# Patient Record
Sex: Male | Born: 1970 | Race: White | Hispanic: No | Marital: Married | Smoking: Former smoker
Health system: Southern US, Community
[De-identification: ages and names within clinical notes are randomized; demographics above are authoritative.]

## PROBLEM LIST (undated history)

## (undated) DIAGNOSIS — F32A Depression, unspecified: Secondary | ICD-10-CM

## (undated) DIAGNOSIS — F329 Major depressive disorder, single episode, unspecified: Secondary | ICD-10-CM

## (undated) DIAGNOSIS — F410 Panic disorder [episodic paroxysmal anxiety] without agoraphobia: Secondary | ICD-10-CM

## (undated) DIAGNOSIS — G8929 Other chronic pain: Secondary | ICD-10-CM

## (undated) DIAGNOSIS — K219 Gastro-esophageal reflux disease without esophagitis: Secondary | ICD-10-CM

## (undated) DIAGNOSIS — M549 Dorsalgia, unspecified: Secondary | ICD-10-CM

## (undated) DIAGNOSIS — F418 Other specified anxiety disorders: Secondary | ICD-10-CM

## (undated) DIAGNOSIS — J939 Pneumothorax, unspecified: Secondary | ICD-10-CM

## (undated) DIAGNOSIS — F419 Anxiety disorder, unspecified: Secondary | ICD-10-CM

## (undated) HISTORY — PX: HERNIA REPAIR: SHX51

## (undated) HISTORY — PX: ESOPHAGOGASTRODUODENOSCOPY: SHX1529

## (undated) HISTORY — DX: Other specified anxiety disorders: F41.8

## (undated) HISTORY — DX: Gastro-esophageal reflux disease without esophagitis: K21.9

---

## 2011-04-19 ENCOUNTER — Encounter (HOSPITAL_COMMUNITY): Payer: Self-pay | Admitting: Pharmacy Technician

## 2011-04-19 ENCOUNTER — Other Ambulatory Visit: Payer: Self-pay | Admitting: Neurosurgery

## 2011-04-20 ENCOUNTER — Encounter (HOSPITAL_COMMUNITY): Payer: Self-pay

## 2011-04-20 ENCOUNTER — Encounter (HOSPITAL_COMMUNITY)
Admission: RE | Admit: 2011-04-20 | Discharge: 2011-04-20 | Disposition: A | Discharge status: Home / Self Care | Payer: Worker's Compensation | Source: Ambulatory Visit | Attending: Neurosurgery | Admitting: Neurosurgery

## 2011-04-20 HISTORY — DX: Anxiety disorder, unspecified: F41.9

## 2011-04-20 HISTORY — DX: Major depressive disorder, single episode, unspecified: F32.9

## 2011-04-20 HISTORY — DX: Gastro-esophageal reflux disease without esophagitis: K21.9

## 2011-04-20 HISTORY — DX: Depression, unspecified: F32.A

## 2011-04-20 HISTORY — DX: Other chronic pain: G89.29

## 2011-04-20 HISTORY — DX: Dorsalgia, unspecified: M54.9

## 2011-04-20 LAB — CBC
HCT: 48.4 % (ref 39.0–52.0)
Hemoglobin: 16.5 g/dL (ref 13.0–17.0)
MCH: 28.4 pg (ref 26.0–34.0)
MCV: 83.4 fL (ref 78.0–100.0)
RBC: 5.8 MIL/uL (ref 4.22–5.81)

## 2011-04-20 LAB — SURGICAL PCR SCREEN
MRSA, PCR: NEGATIVE
Staphylococcus aureus: NEGATIVE

## 2011-04-20 NOTE — Pre-Procedure Instructions (Signed)
20 Aaron Meyer  04/20/2011   Your procedure is scheduled on:  Thurs,Jan 24 @ 1210  Report to [facility] at 0900 AM.  Call this number if you have problems the morning of surgery: [phone]   Remember:   Do not eat food:After Midnight.  May have clear liquids: up to 4 Hours before arrival.(until 5 am)  Clear liquids include soda, tea, black coffee, apple or grape juice, broth.  Take these medicines the morning of surgery with A SIP OF WATER: Xanax,Dexilant,Paxil,Robaxin,and Pain Pill(if needed)   Do not wear jewelry, make-up or nail polish.  Do not wear lotions, powders, or perfumes. You may wear deodorant.  Do not shave 48 hours prior to surgery.  Do not bring valuables to the hospital.  Contacts, dentures or bridgework may not be worn into surgery.  Leave suitcase in the car. After surgery it may be brought to your room.  For patients admitted to the hospital, checkout time is 11:00 AM the day of discharge.   Patients discharged the day of surgery will not be allowed to drive home.  Name and phone number of your driver:   Special Instructions: CHG Shower Use Special Wash: 1/2 bottle night before surgery and 1/2 bottle morning of surgery.   Please read over the following fact sheets that you were given: Pain Booklet, Coughing and Deep Breathing, MRSA Information and Surgical Site Infection Prevention

## 2011-04-20 NOTE — Progress Notes (Signed)
Pt states his sugar tends to run on the low side

## 2011-04-20 NOTE — Progress Notes (Signed)
Stress test done > 43yrs ago  Never had an echo or a heart catherization

## 2011-04-21 MED ORDER — CEFAZOLIN SODIUM-DEXTROSE 2-3 GM-% IV SOLR
2.0000 g | INTRAVENOUS | Status: AC
  Administered 2011-04-22: 2 g via INTRAVENOUS
  Filled 2011-04-21: qty 50

## 2011-04-22 ENCOUNTER — Encounter (HOSPITAL_COMMUNITY): Payer: Self-pay | Admitting: Certified Registered"

## 2011-04-22 ENCOUNTER — Encounter (HOSPITAL_COMMUNITY): Payer: Self-pay | Admitting: *Deleted

## 2011-04-22 ENCOUNTER — Ambulatory Visit (HOSPITAL_COMMUNITY)
Admission: RE | Admit: 2011-04-22 | Discharge: 2011-04-23 | Disposition: A | Discharge status: Home / Self Care | Payer: Worker's Compensation | Source: Ambulatory Visit | Attending: Neurosurgery | Admitting: Neurosurgery

## 2011-04-22 ENCOUNTER — Ambulatory Visit (HOSPITAL_COMMUNITY): Payer: Worker's Compensation | Admitting: Certified Registered"

## 2011-04-22 ENCOUNTER — Encounter (HOSPITAL_COMMUNITY)
Admission: RE | Disposition: A | Discharge status: Home / Self Care | Payer: Self-pay | Source: Ambulatory Visit | Attending: Neurosurgery

## 2011-04-22 ENCOUNTER — Ambulatory Visit (HOSPITAL_COMMUNITY): Payer: Worker's Compensation

## 2011-04-22 DIAGNOSIS — M51379 Other intervertebral disc degeneration, lumbosacral region without mention of lumbar back pain or lower extremity pain: Secondary | ICD-10-CM | POA: Insufficient documentation

## 2011-04-22 DIAGNOSIS — M5137 Other intervertebral disc degeneration, lumbosacral region: Secondary | ICD-10-CM | POA: Insufficient documentation

## 2011-04-22 DIAGNOSIS — Z01812 Encounter for preprocedural laboratory examination: Secondary | ICD-10-CM | POA: Insufficient documentation

## 2011-04-22 DIAGNOSIS — M5126 Other intervertebral disc displacement, lumbar region: Secondary | ICD-10-CM | POA: Insufficient documentation

## 2011-04-22 DIAGNOSIS — K219 Gastro-esophageal reflux disease without esophagitis: Secondary | ICD-10-CM | POA: Insufficient documentation

## 2011-04-22 DIAGNOSIS — J45909 Unspecified asthma, uncomplicated: Secondary | ICD-10-CM | POA: Insufficient documentation

## 2011-04-22 DIAGNOSIS — F3289 Other specified depressive episodes: Secondary | ICD-10-CM | POA: Insufficient documentation

## 2011-04-22 DIAGNOSIS — F329 Major depressive disorder, single episode, unspecified: Secondary | ICD-10-CM | POA: Insufficient documentation

## 2011-04-22 DIAGNOSIS — M47817 Spondylosis without myelopathy or radiculopathy, lumbosacral region: Secondary | ICD-10-CM | POA: Insufficient documentation

## 2011-04-22 DIAGNOSIS — F411 Generalized anxiety disorder: Secondary | ICD-10-CM | POA: Insufficient documentation

## 2011-04-22 HISTORY — PX: LUMBAR LAMINECTOMY/DECOMPRESSION MICRODISCECTOMY: SHX5026

## 2011-04-22 LAB — GLUCOSE, CAPILLARY: Glucose-Capillary: 101 mg/dL — ABNORMAL HIGH (ref 70–99)

## 2011-04-22 SURGERY — LUMBAR LAMINECTOMY/DECOMPRESSION MICRODISCECTOMY
Anesthesia: General | Laterality: Left

## 2011-04-22 MED ORDER — FENTANYL CITRATE 0.05 MG/ML IJ SOLN
INTRAMUSCULAR | Status: AC
  Filled 2011-04-22: qty 2

## 2011-04-22 MED ORDER — PROPOFOL 10 MG/ML IV EMUL
INTRAVENOUS | Status: DC | PRN
  Administered 2011-04-22: 200 mg via INTRAVENOUS

## 2011-04-22 MED ORDER — KCL IN DEXTROSE-NACL 20-5-0.45 MEQ/L-%-% IV SOLN
INTRAVENOUS | Status: DC
  Filled 2011-04-22 (×5): qty 1000

## 2011-04-22 MED ORDER — HYDROCODONE-ACETAMINOPHEN 5-325 MG PO TABS
1.0000 | ORAL_TABLET | ORAL | Status: DC | PRN
  Administered 2011-04-23: 1 via ORAL
  Filled 2011-04-22: qty 2

## 2011-04-22 MED ORDER — ONDANSETRON HCL 4 MG/2ML IJ SOLN
INTRAMUSCULAR | Status: DC | PRN
  Administered 2011-04-22: 4 mg via INTRAVENOUS

## 2011-04-22 MED ORDER — LIDOCAINE-EPINEPHRINE 1 %-1:100000 IJ SOLN
INTRAMUSCULAR | Status: DC | PRN
  Administered 2011-04-22: 5 mL

## 2011-04-22 MED ORDER — CYCLOBENZAPRINE HCL 10 MG PO TABS
10.0000 mg | ORAL_TABLET | Freq: Three times a day (TID) | ORAL | Status: DC | PRN
  Administered 2011-04-22 – 2011-04-23 (×2): 10 mg via ORAL
  Filled 2011-04-22 (×2): qty 1

## 2011-04-22 MED ORDER — HEMOSTATIC AGENTS (NO CHARGE) OPTIME
TOPICAL | Status: DC | PRN
  Administered 2011-04-22: 1 via TOPICAL

## 2011-04-22 MED ORDER — NEOSTIGMINE METHYLSULFATE 1 MG/ML IJ SOLN
INTRAMUSCULAR | Status: DC | PRN
  Administered 2011-04-22: 5 mg via INTRAVENOUS

## 2011-04-22 MED ORDER — GLYCOPYRROLATE 0.2 MG/ML IJ SOLN
INTRAMUSCULAR | Status: DC | PRN
Start: 2011-04-22 — End: 2011-04-22
  Administered 2011-04-22: .6 mg via INTRAVENOUS

## 2011-04-22 MED ORDER — BISACODYL 10 MG RE SUPP: 10.0000 mg | Freq: Every day | RECTAL | Status: DC | PRN

## 2011-04-22 MED ORDER — MEPERIDINE HCL 25 MG/ML IJ SOLN: 6.2500 mg | INTRAMUSCULAR | Status: DC | PRN

## 2011-04-22 MED ORDER — SODIUM CHLORIDE 0.9 % IJ SOLN: 3.0000 mL | Freq: Two times a day (BID) | INTRAMUSCULAR | Status: DC

## 2011-04-22 MED ORDER — HYDROMORPHONE HCL PF 1 MG/ML IJ SOLN
0.2500 mg | INTRAMUSCULAR | Status: DC | PRN
  Administered 2011-04-22: 0.25 mg via INTRAVENOUS

## 2011-04-22 MED ORDER — KETOROLAC TROMETHAMINE 30 MG/ML IJ SOLN: 15.0000 mg | Freq: Once | INTRAMUSCULAR | Status: DC | PRN

## 2011-04-22 MED ORDER — SODIUM CHLORIDE 0.9 % IV SOLN
INTRAVENOUS | Status: AC
  Filled 2011-04-22: qty 500

## 2011-04-22 MED ORDER — MORPHINE SULFATE 4 MG/ML IJ SOLN: 4.0000 mg | INTRAMUSCULAR | Status: DC | PRN

## 2011-04-22 MED ORDER — FENTANYL CITRATE 0.05 MG/ML IJ SOLN
INTRAMUSCULAR | Status: DC | PRN
  Administered 2011-04-22: 100 ug via INTRAVENOUS
  Administered 2011-04-22 (×2): 50 ug via INTRAVENOUS

## 2011-04-22 MED ORDER — ROCURONIUM BROMIDE 100 MG/10ML IV SOLN
INTRAVENOUS | Status: DC | PRN
Start: 2011-04-22 — End: 2011-04-22
  Administered 2011-04-22: 10 mg via INTRAVENOUS
  Administered 2011-04-22: 50 mg via INTRAVENOUS

## 2011-04-22 MED ORDER — MENTHOL 3 MG MT LOZG: 1.0000 | LOZENGE | OROMUCOSAL | Status: DC | PRN

## 2011-04-22 MED ORDER — HYDROXYZINE HCL 50 MG/ML IM SOLN: 50.0000 mg | INTRAMUSCULAR | Status: DC | PRN

## 2011-04-22 MED ORDER — ACETAMINOPHEN 325 MG PO TABS: 650.0000 mg | ORAL_TABLET | ORAL | Status: DC | PRN

## 2011-04-22 MED ORDER — ACETAMINOPHEN 650 MG RE SUPP: 650.0000 mg | RECTAL | Status: DC | PRN

## 2011-04-22 MED ORDER — KETOROLAC TROMETHAMINE 30 MG/ML IJ SOLN: 30.0000 mg | Freq: Once | INTRAMUSCULAR | Status: DC

## 2011-04-22 MED ORDER — METHYLPREDNISOLONE ACETATE PF 80 MG/ML IJ SUSP
INTRAMUSCULAR | Status: DC | PRN
  Administered 2011-04-22: 80 mg

## 2011-04-22 MED ORDER — SODIUM CHLORIDE 0.9 % IV SOLN: 250.0000 mL | INTRAVENOUS | Status: DC

## 2011-04-22 MED ORDER — PROMETHAZINE HCL 25 MG/ML IJ SOLN: 6.2500 mg | INTRAMUSCULAR | Status: DC | PRN

## 2011-04-22 MED ORDER — SODIUM CHLORIDE 0.9 % IR SOLN
Status: DC | PRN
  Administered 2011-04-22: 13:00:00

## 2011-04-22 MED ORDER — ZOLPIDEM TARTRATE 5 MG PO TABS: 10.0000 mg | ORAL_TABLET | Freq: Every evening | ORAL | Status: DC | PRN

## 2011-04-22 MED ORDER — MIDAZOLAM HCL 5 MG/5ML IJ SOLN
INTRAMUSCULAR | Status: DC | PRN
  Administered 2011-04-22: 2 mg via INTRAVENOUS

## 2011-04-22 MED ORDER — BUPIVACAINE HCL (PF) 0.5 % IJ SOLN
INTRAMUSCULAR | Status: DC | PRN
  Administered 2011-04-22: 5 mL

## 2011-04-22 MED ORDER — MAGNESIUM HYDROXIDE 400 MG/5ML PO SUSP: 30.0000 mL | Freq: Every day | ORAL | Status: DC | PRN

## 2011-04-22 MED ORDER — KETOROLAC TROMETHAMINE 30 MG/ML IJ SOLN
30.0000 mg | Freq: Four times a day (QID) | INTRAMUSCULAR | Status: DC
  Administered 2011-04-22 – 2011-04-23 (×3): 30 mg via INTRAVENOUS
  Filled 2011-04-22 (×8): qty 1

## 2011-04-22 MED ORDER — OXYCODONE-ACETAMINOPHEN 5-325 MG PO TABS: 1.0000 | ORAL_TABLET | ORAL | Status: DC | PRN

## 2011-04-22 MED ORDER — HYDROXYZINE HCL 25 MG PO TABS: 50.0000 mg | ORAL_TABLET | ORAL | Status: DC | PRN

## 2011-04-22 MED ORDER — PHENOL 1.4 % MT LIQD: 1.0000 | OROMUCOSAL | Status: DC | PRN

## 2011-04-22 MED ORDER — ALUM & MAG HYDROXIDE-SIMETH 200-200-20 MG/5ML PO SUSP: 30.0000 mL | Freq: Four times a day (QID) | ORAL | Status: DC | PRN

## 2011-04-22 MED ORDER — HYDROMORPHONE HCL PF 1 MG/ML IJ SOLN
INTRAMUSCULAR | Status: AC
  Filled 2011-04-22: qty 1

## 2011-04-22 MED ORDER — 0.9 % SODIUM CHLORIDE (POUR BTL) OPTIME
TOPICAL | Status: DC | PRN
  Administered 2011-04-22: 1000 mL

## 2011-04-22 MED ORDER — BACITRACIN 50000 UNITS IM SOLR
INTRAMUSCULAR | Status: AC
  Filled 2011-04-22: qty 1

## 2011-04-22 MED ORDER — THROMBIN 5000 UNITS EX SOLR
CUTANEOUS | Status: DC | PRN
  Administered 2011-04-22 (×2): 5000 [IU] via TOPICAL

## 2011-04-22 MED ORDER — LACTATED RINGERS IV SOLN
INTRAVENOUS | Status: DC | PRN
Start: 2011-04-22 — End: 2011-04-22
  Administered 2011-04-22 (×3): via INTRAVENOUS

## 2011-04-22 MED ORDER — SODIUM CHLORIDE 0.9 % IJ SOLN
3.0000 mL | INTRAMUSCULAR | Status: DC | PRN
  Administered 2011-04-23: 3 mL via INTRAVENOUS

## 2011-04-22 SURGICAL SUPPLY — 59 items
ADH SKN CLS APL DERMABOND .7 (GAUZE/BANDAGES/DRESSINGS) ×2
APL SKNCLS STERI-STRIP NONHPOA (GAUZE/BANDAGES/DRESSINGS)
BAG DECANTER FOR FLEXI CONT (MISCELLANEOUS) ×2 IMPLANT
BENZOIN TINCTURE PRP APPL 2/3 (GAUZE/BANDAGES/DRESSINGS) IMPLANT
BLADE SURG ROTATE 9660 (MISCELLANEOUS) IMPLANT
BRUSH SCRUB EZ PLAIN DRY (MISCELLANEOUS) ×2 IMPLANT
BUR ACORN 6.0 ACORN (BURR) IMPLANT
BUR ACRON 5.0MM COATED (BURR) IMPLANT
BUR MATCHSTICK NEURO 3.0 LAGG (BURR) ×2 IMPLANT
CANISTER SUCTION 2500CC (MISCELLANEOUS) ×2 IMPLANT
CLOTH BEACON ORANGE TIMEOUT ST (SAFETY) ×2 IMPLANT
CONT SPEC 4OZ CLIKSEAL STRL BL (MISCELLANEOUS) IMPLANT
DERMABOND ADVANCED (GAUZE/BANDAGES/DRESSINGS) ×2
DERMABOND ADVANCED .7 DNX12 (GAUZE/BANDAGES/DRESSINGS) IMPLANT
DRAPE LAPAROTOMY 100X72X124 (DRAPES) ×2 IMPLANT
DRAPE MICROSCOPE LEICA (MISCELLANEOUS) ×2 IMPLANT
DRAPE POUCH INSTRU U-SHP 10X18 (DRAPES) ×2 IMPLANT
DRSG EMULSION OIL 3X3 NADH (GAUZE/BANDAGES/DRESSINGS) IMPLANT
ELECT REM PT RETURN 9FT ADLT (ELECTROSURGICAL) ×2
ELECTRODE REM PT RTRN 9FT ADLT (ELECTROSURGICAL) ×1 IMPLANT
GAUZE SPONGE 4X4 16PLY XRAY LF (GAUZE/BANDAGES/DRESSINGS) IMPLANT
GLOVE BIOGEL PI IND STRL 8 (GLOVE) ×1 IMPLANT
GLOVE BIOGEL PI INDICATOR 8 (GLOVE) ×1
GLOVE ECLIPSE 7.5 STRL STRAW (GLOVE) ×4 IMPLANT
GLOVE EXAM NITRILE LRG STRL (GLOVE) IMPLANT
GLOVE EXAM NITRILE MD LF STRL (GLOVE) ×1 IMPLANT
GLOVE EXAM NITRILE XL STR (GLOVE) IMPLANT
GLOVE EXAM NITRILE XS STR PU (GLOVE) IMPLANT
GLOVE INDICATOR 8.0 STRL GRN (GLOVE) ×1 IMPLANT
GOWN BRE IMP SLV AUR LG STRL (GOWN DISPOSABLE) ×2 IMPLANT
GOWN BRE IMP SLV AUR XL STRL (GOWN DISPOSABLE) ×2 IMPLANT
GOWN STRL REIN 2XL LVL4 (GOWN DISPOSABLE) ×1 IMPLANT
KIT BASIN OR (CUSTOM PROCEDURE TRAY) ×2 IMPLANT
KIT ROOM TURNOVER OR (KITS) ×2 IMPLANT
NDL HYPO 18GX1.5 BLUNT FILL (NEEDLE) IMPLANT
NDL SPNL 18GX3.5 QUINCKE PK (NEEDLE) ×1 IMPLANT
NDL SPNL 22GX3.5 QUINCKE BK (NEEDLE) ×1 IMPLANT
NEEDLE HYPO 18GX1.5 BLUNT FILL (NEEDLE) IMPLANT
NEEDLE SPNL 18GX3.5 QUINCKE PK (NEEDLE) ×2 IMPLANT
NEEDLE SPNL 22GX3.5 QUINCKE BK (NEEDLE) ×2 IMPLANT
NS IRRIG 1000ML POUR BTL (IV SOLUTION) ×2 IMPLANT
PACK LAMINECTOMY NEURO (CUSTOM PROCEDURE TRAY) ×2 IMPLANT
PAD ARMBOARD 7.5X6 YLW CONV (MISCELLANEOUS) ×6 IMPLANT
PATTIES SURGICAL .5 X1 (DISPOSABLE) ×2 IMPLANT
RUBBERBAND STERILE (MISCELLANEOUS) ×4 IMPLANT
SPONGE GAUZE 4X4 12PLY (GAUZE/BANDAGES/DRESSINGS) IMPLANT
SPONGE LAP 4X18 X RAY DECT (DISPOSABLE) IMPLANT
SPONGE SURGIFOAM ABS GEL SZ50 (HEMOSTASIS) ×2 IMPLANT
STRIP CLOSURE SKIN 1/2X4 (GAUZE/BANDAGES/DRESSINGS) IMPLANT
SUT PROLENE 6 0 BV (SUTURE) IMPLANT
SUT VIC AB 1 CT1 18XBRD ANBCTR (SUTURE) ×1 IMPLANT
SUT VIC AB 1 CT1 8-18 (SUTURE) ×2
SUT VIC AB 2-0 CP2 18 (SUTURE) ×2 IMPLANT
SUT VIC AB 3-0 SH 8-18 (SUTURE) IMPLANT
SYR 20CC LL (SYRINGE) ×2 IMPLANT
SYR 5ML LL (SYRINGE) ×1 IMPLANT
TOWEL OR 17X24 6PK STRL BLUE (TOWEL DISPOSABLE) ×2 IMPLANT
TOWEL OR 17X26 10 PK STRL BLUE (TOWEL DISPOSABLE) ×2 IMPLANT
WATER STERILE IRR 1000ML POUR (IV SOLUTION) ×2 IMPLANT

## 2011-04-22 NOTE — Transfer of Care (Signed)
Immediate Anesthesia Transfer of Care Note  Patient: Aaron Meyer  Procedure(s) Performed:  LUMBAR LAMINECTOMY/DECOMPRESSION MICRODISCECTOMY - LEFT Lumbar five sacal one laminotomy and microdiskectomy  Patient Location: PACU  Anesthesia Type: General  Level of Consciousness: awake  Airway & Oxygen Therapy: Patient Spontanous Breathing  Post-op Assessment: Report given to PACU RN  Post vital signs: stable  Complications: No apparent anesthesia complications

## 2011-04-22 NOTE — Anesthesia Postprocedure Evaluation (Signed)
  Anesthesia Post-op Note  Patient: Aaron Meyer  Procedure(s) Performed:  LUMBAR LAMINECTOMY/DECOMPRESSION MICRODISCECTOMY - LEFT Lumbar five sacal one laminotomy and microdiskectomy  Patient Location: PACU  Anesthesia Type: General  Level of Consciousness: awake and alert   Airway and Oxygen Therapy: Patient Spontanous Breathing and Patient connected to nasal cannula oxygen  Post-op Pain: none  Post-op Assessment: Post-op Vital signs reviewed  Post-op Vital Signs: Reviewed and stable  Complications: No apparent anesthesia complications

## 2011-04-22 NOTE — Anesthesia Preprocedure Evaluation (Addendum)
Anesthesia Evaluation  Patient identified by MRN, date of birth, ID band Patient awake    Reviewed: Allergy & Precautions, H&P , NPO status , Patient's Chart, lab work & pertinent test results, reviewed documented beta blocker date and time   History of Anesthesia Complications Negative for: history of anesthetic complications  Airway Mallampati: I  Neck ROM: Full  Mouth opening: Limited Mouth Opening  Dental  (+) Teeth Intact, Poor Dentition, Chipped and Dental Advisory Given   Pulmonary neg pulmonary ROS, asthma ,  clear to auscultation        Cardiovascular neg cardio ROS Regular Normal    Neuro/Psych PSYCHIATRIC DISORDERS Anxiety Depression    GI/Hepatic GERD-  Medicated and Controlled,  Endo/Other    Renal/GU      Musculoskeletal   Abdominal   Peds  Hematology   Anesthesia Other Findings   Reproductive/Obstetrics                          Anesthesia Physical Anesthesia Plan  ASA: II  Anesthesia Plan: General   Post-op Pain Management:    Induction: Intravenous  Airway Management Planned: Oral ETT  Additional Equipment:   Intra-op Plan:   Post-operative Plan: Extubation in OR  Informed Consent: I have reviewed the patients History and Physical, chart, labs and discussed the procedure including the risks, benefits and alternatives for the proposed anesthesia with the patient or authorized representative who has indicated his/her understanding and acceptance.   Dental advisory given  Plan Discussed with: CRNA and Surgeon  Anesthesia Plan Comments:         Anesthesia Quick Evaluation

## 2011-04-22 NOTE — Plan of Care (Signed)
Problem: Consults Goal: Diagnosis - Spinal Surgery Outcome: Not Applicable Date Met:  04/22/11 Lumbar Laminectomy (Complex)

## 2011-04-22 NOTE — Anesthesia Procedure Notes (Signed)
Procedure Name: Intubation Date/Time: 04/22/2011 1:05 PM Performed by: Margaree Mackintosh Pre-anesthesia Checklist: Patient identified, Timeout performed, Emergency Drugs available, Suction available and Patient being monitored Patient Re-evaluated:Patient Re-evaluated prior to inductionOxygen Delivery Method: Circle System Utilized Preoxygenation: Pre-oxygenation with 100% oxygen Intubation Type: IV induction Ventilation: Mask ventilation without difficulty and Oral airway inserted - appropriate to patient size Laryngoscope Size: Mac and 3 Grade View: Grade I Tube type: Oral Tube size: 7.5 mm Number of attempts: 1 Airway Equipment and Method: stylet Placement Confirmation: ETT inserted through vocal cords under direct vision,  positive ETCO2 and breath sounds checked- equal and bilateral Secured at: 22 cm Tube secured with: Tape Dental Injury: Teeth and Oropharynx as per pre-operative assessment

## 2011-04-22 NOTE — Progress Notes (Signed)
Filed Vitals:   04/22/11 0917 04/22/11 1445 04/22/11 1515  BP: 163/87    Pulse: 76    Temp: 97 F (36.1 C) 98 F (36.7 C) 97.9 F (36.6 C)  TempSrc: Oral    Resp: 20    SpO2: 98%      CBC  Basename 04/20/11 0906  WBC 7.6  HGB 16.5  HCT 48.4  PLT 171   Patient resting recovery room , anesthesia has cleared substantially. Following commands with all 4 extremities. Good strength in lower extremities. Incision clean and dry. No void yet.  Plan: To be transferred to 3500 unit. To ambulate once anesthesia fully cleared.  Hewitt Shorts, MD 04/22/2011, 3:24 PM

## 2011-04-22 NOTE — Preoperative (Signed)
Beta Blockers   Reason not to administer Beta Blockers:Not Applicable. No home beta blockers 

## 2011-04-22 NOTE — Op Note (Signed)
04/22/2011  2:28 PM  PATIENT:  Aaron Meyer  42 y.o. male  PRE-OPERATIVE DIAGNOSIS:  lumbar herniated disc lumbar five sacral one degenerative disc disease lumbar spondylosis lumbar radiculopathy  POST-OPERATIVE DIAGNOSIS:  umbar herniated disc lumbar five sacral one degenerative disc disease lumbar spondylosis lumbar radiculopathy  PROCEDURE:  Procedure(s): LUMBAR LAMINECTOMY/DECOMPRESSION MICRODISCECTOMY, left L5-S1  SURGEON:  Surgeon(s): Hewitt Shorts, MD  ASSISTANTS: Colon Branch, M.D.  ANESTHESIA:   general  EBL:  Total I/O In: 1000 [I.V.:1000] Out: 25 [Blood:25]  BLOOD ADMINISTERED:none  COUNT: Correct per nursing staff  DICTATION: Patient was brought to the operating room and placed under general endotracheal anesthesia. Patient was turned to prone position the lumbar region was prepped with Betadine soap and solution and draped in a sterile fashion. The midline was infiltrated with local anesthetic with epinephrine. A localizing x-ray was taken and the L5-S1 level was identified. Midline incision was made over the L5-S1 level and was carried down through the subcutaneous tissue to the lumbar fascia. The lumbar fascia was incised on the left side and the paraspinal muscles were dissected from the spinous processes and lamina in a subperiosteal fashion. Another x-ray was taken and the L5-S1 intralaminar space was identified. Laminotomy was performed using the high-speed drill and Kerrison punches. The ligamentum flavum was carefully resected. The underlying thecal sac and nerve root were identified. The disc herniation was identified and the thecal sac and nerve root gently retracted medially. The disc herniation was found in the left lateral epidural space below the level of the disc space. A large fragment was removed en bloc. Several smaller pieces were removed as well. We then examined the epidural space, no further fragments were found and the annulus of the disc  appeared intact. It was felt that there be no benefit to entering the disc space, and it was felt that good decompression of the thecal sac and nerve root had been achieved. Once the discectomy was completed and good decompression of the thecal sac and nerve had been achieved hemostasis was established with the use of bipolar cautery and Gelfoam with thrombin. The Gelfoam was removed and hemostasis confirmed. We then instilled 2 cc of fentanyl and 80 mg of Depo-Medrol into the epidural space. Deep fascia was closed with interrupted undyed 1 Vicryl sutures. Scarpa's fascia was closed with interrupted undyed 1 Vicryl sutures in the subcutaneous and subcuticular layer were closed with interrupted inverted 2-0 undyed Vicryl sutures. The skin edges were approximated with Dermabond. Following surgery the patient was turned back to a supine position to be reversed from the anesthetic extubated and transferred to the recovery room for further care.   PLAN OF CARE: Admit to inpatient   PATIENT DISPOSITION:  PACU - hemodynamically stable.   Delay start of Pharmacological VTE agent (>24hrs) due to surgical blood loss or risk of bleeding:  yes

## 2011-04-22 NOTE — H&P (Signed)
Subjective: Patient is a 41 y.o. male who is admitted for treatment of severe disabling left lumbar radiculopathy secondary to large left L5-S1 lumbar disc herniation with fragment has migrated caudally behind the body of S1, compressing the exiting left S1 nerve root. Patient is admitted for a left L5-S1 lumbar laminotomy and microdiscectomy.    Past Medical History  Diagnosis Date  . Pneumothorax     couple of times but didn't require a chest tube;age 64 and 26  . Chronic back pain     herniated disc,ddd,spondylosis and radiculopathy  . GERD (gastroesophageal reflux disease)     takes Dexilant daily  . Hemorrhoids   . Anxiety     takes Xanax as needed  . Depression     takes Paxil daily    Past Surgical History  Procedure Date  . Hernia repair     umbilical as a baby  . Esophagogastroduodenoscopy     Prescriptions prior to admission  Medication Sig Dispense Refill  . ALPRAZolam (XANAX) 0.5 MG tablet Take 0.5 mg by mouth 2 (two) times daily as needed. For sleep and anxiety      . calcium carbonate (TUMS EX) 750 MG chewable tablet Chew 2-3 tablets by mouth 3 (three) times daily as needed. heartburn      . dexlansoprazole (DEXILANT) 60 MG capsule Take 60 mg by mouth daily.      Marland Kitchen HYDROcodone-acetaminophen (NORCO) 5-325 MG per tablet Take 1-2 tablets by mouth every 6 (six) hours as needed. For pain      . meloxicam (MOBIC) 7.5 MG tablet Take 7.5 mg by mouth 2 (two) times daily.      . methocarbamol (ROBAXIN) 500 MG tablet Take 500 mg by mouth 3 (three) times daily. For muscle spasms      . PARoxetine (PAXIL) 20 MG tablet Take 20 mg by mouth every morning.       Allergies  Allergen Reactions  . Biaxin Nausea And Vomiting  . Other Other (See Comments)    Eggs and shrimp (hives)    History  Substance Use Topics  . Smoking status: Current Everyday Smoker -- 1.0 packs/day for 20 years  . Smokeless tobacco: Not on file  . Alcohol Use: No    Family History  Problem Relation Age  of Onset  . Anesthesia problems Neg Hx   . Hypotension Neg Hx   . Malignant hyperthermia Neg Hx   . Pseudochol deficiency Neg Hx      Review of Systems A comprehensive review of systems was negative.  Objective: Vital signs in last 24 hours: Temp:  [97 F (36.1 C)] 97 F (36.1 C) (01/24 0917) Pulse Rate:  [76] 76  (01/24 0917) Resp:  [20] 20  (01/24 0917) BP: (163)/(87) 163/87 mmHg (01/24 0917) SpO2:  [98 %] 98 % (01/24 0917)  EXAM: Well-developed well-nourished white male in no acute distress. Lungs are clear to auscultation , the patient has symmetrical respiratory excursion. Heart has a regular rate and rhythm normal S1 and S2 no murmur.   Abdomen is soft nontender nondistended bowel sounds are present. Extremity examination shows no clubbing cyanosis or edema. Musculoskeletal examination shows no tenderness to palpation in the lumbar region. Forward flexion is limited by pain at about 20. Straight leg raising is possible left at 50. Straight leg raising is negative on the right.  Motor examination shows 5 over 5 strength in the lower extremities including the iliopsoas quadriceps dorsiflexor extensor hallicus  longus and plantar flexor bilaterally.  Sensation is intact to pinprick in the distal lower extremities. Reflexes are symmetrical bilaterally. No pathologic reflexes are present. Patient has a normal gait and stance.   Data Review:CBC    Component Value Date/Time   WBC 7.6 04/20/2011 0906   RBC 5.80 04/20/2011 0906   HGB 16.5 04/20/2011 0906   HCT 48.4 04/20/2011 0906   PLT 171 04/20/2011 0906   MCV 83.4 04/20/2011 0906   MCH 28.4 04/20/2011 0906   MCHC 34.1 04/20/2011 0906   RDW 13.0 04/20/2011 0906    Assessment/Plan: Patient with a large left L5-S1 lumbar disc herniation for a left L5-S1 lumbar laminotomy and microdiscectomy. I've discussed with the patient the nature of his condition, the nature the surgical procedure, the typical length of surgery, hospital stay, and  overall recuperation. We discussed limitations postoperatively. I discussed risks of surgery including risks of infection, bleeding, possibly need for transfusion, the risk of nerve root dysfunction with pain, weakness, numbness, or paresthesias, or risk of dural tear and CSF leakage and possible need for further surgery, the risk of recurrent disc herniation and the possible need for further surgery, and the risk of anesthetic complications including myocardial infarction, stroke, pneumonia, and death. Understanding all this the patient does wish to proceed with surgery and is admitted for such.    Hewitt Shorts, MD 04/22/2011 12:21 PM

## 2011-04-23 ENCOUNTER — Encounter: Payer: Self-pay | Admitting: *Deleted

## 2011-04-23 MED ORDER — HYDROCODONE-ACETAMINOPHEN 5-325 MG PO TABS: 1.0000 | ORAL_TABLET | ORAL | Status: DC | PRN

## 2011-04-23 NOTE — Discharge Summary (Signed)
Physician Discharge Summary  Patient ID: Aaron Meyer MRN: [id number] DOB/AGE: [id number] 43 y.o.  Admit date: 04/22/2011 Discharge date: 04/23/2011  Admission Diagnoses: Left L5-S1 lumbar disc herniation, lumbar degenerative disc disease, lumbar spondylosis, lumbar radiculopathy  Discharge Diagnoses: Left L5-S1 lumbar disc herniation, lumbar degenerative disc disease, lumbar spondylosis, lumbar radiculopathy  Discharged Condition: good  Hospital Course: Patient was admitted underwent a left L5-S1 lumbar laminotomy and microdiscectomy. He has done well following surgery with good relief of his radicular pain. He is up and ambulating actively. His incision is clean and dry and healing well. He has been given instructions regarding wound care and activities following discharge. He is to return for followup with me in 3 weeks.  Discharge Exam: Blood pressure 112/67, pulse 79, temperature 98.2 F (36.8 C), temperature source Oral, resp. rate 18, SpO2 95.00%.  Disposition: Home   Medication List  As of 04/23/2011  7:41 AM   TAKE these medications         ALPRAZolam 0.5 MG tablet   Commonly known as: XANAX   Take 0.5 mg by mouth 2 (two) times daily as needed. For sleep and anxiety      calcium carbonate 750 MG chewable tablet   Commonly known as: TUMS EX   Chew 2-3 tablets by mouth 3 (three) times daily as needed. heartburn      dexlansoprazole 60 MG capsule   Commonly known as: DEXILANT   Take 60 mg by mouth daily.      HYDROcodone-acetaminophen 5-325 MG per tablet   Commonly known as: NORCO   Take 1-2 tablets by mouth every 6 (six) hours as needed. For pain      HYDROcodone-acetaminophen 5-325 MG per tablet   Commonly known as: NORCO   Take 1-2 tablets by mouth every 4 (four) hours as needed.      meloxicam 7.5 MG tablet   Commonly known as: MOBIC   Take 7.5 mg by mouth 2 (two) times daily.      methocarbamol 500 MG tablet   Commonly known as: ROBAXIN   Take 500 mg by  mouth 3 (three) times daily. For muscle spasms      PARoxetine 20 MG tablet   Commonly known as: PAXIL   Take 20 mg by mouth every morning.             Signed: Hewitt Shorts, MD 04/23/2011, 7:41 AM

## 2011-04-27 ENCOUNTER — Emergency Department (HOSPITAL_COMMUNITY): Payer: Worker's Compensation

## 2011-04-27 ENCOUNTER — Emergency Department (HOSPITAL_COMMUNITY)
Admission: EM | Admit: 2011-04-27 | Discharge: 2011-04-27 | Discharge status: Against Medical Advice | Payer: Self-pay | Attending: Emergency Medicine | Admitting: Emergency Medicine

## 2011-04-27 ENCOUNTER — Encounter (HOSPITAL_COMMUNITY): Payer: Self-pay | Admitting: Physical Medicine and Rehabilitation

## 2011-04-27 ENCOUNTER — Inpatient Hospital Stay (HOSPITAL_COMMUNITY)
Admission: EM | Admit: 2011-04-27 | Discharge: 2011-05-07 | DRG: 030 | Disposition: A | Discharge status: Home / Self Care | Payer: Worker's Compensation | Attending: Neurosurgery | Admitting: Neurosurgery

## 2011-04-27 DIAGNOSIS — J45909 Unspecified asthma, uncomplicated: Secondary | ICD-10-CM | POA: Diagnosis present

## 2011-04-27 DIAGNOSIS — F329 Major depressive disorder, single episode, unspecified: Secondary | ICD-10-CM | POA: Diagnosis present

## 2011-04-27 DIAGNOSIS — Z888 Allergy status to other drugs, medicaments and biological substances status: Secondary | ICD-10-CM

## 2011-04-27 DIAGNOSIS — Z0389 Encounter for observation for other suspected diseases and conditions ruled out: Secondary | ICD-10-CM | POA: Insufficient documentation

## 2011-04-27 DIAGNOSIS — R51 Headache: Secondary | ICD-10-CM | POA: Diagnosis present

## 2011-04-27 DIAGNOSIS — K219 Gastro-esophageal reflux disease without esophagitis: Secondary | ICD-10-CM | POA: Diagnosis present

## 2011-04-27 DIAGNOSIS — G988 Other disorders of nervous system: Principal | ICD-10-CM | POA: Diagnosis present

## 2011-04-27 DIAGNOSIS — F3289 Other specified depressive episodes: Secondary | ICD-10-CM | POA: Diagnosis present

## 2011-04-27 DIAGNOSIS — F411 Generalized anxiety disorder: Secondary | ICD-10-CM | POA: Diagnosis present

## 2011-04-27 DIAGNOSIS — G971 Other reaction to spinal and lumbar puncture: Secondary | ICD-10-CM

## 2011-04-27 LAB — CBC
HCT: 48 % (ref 39.0–52.0)
Hemoglobin: 17.2 g/dL — ABNORMAL HIGH (ref 13.0–17.0)
MCH: 29.5 pg (ref 26.0–34.0)
MCHC: 35.8 g/dL (ref 30.0–36.0)
MCV: 82.2 fL (ref 78.0–100.0)
Platelets: 166 K/uL (ref 150–400)
RBC: 5.84 MIL/uL — ABNORMAL HIGH (ref 4.22–5.81)
RDW: 12.7 % (ref 11.5–15.5)
WBC: 9.1 K/uL (ref 4.0–10.5)

## 2011-04-27 LAB — BASIC METABOLIC PANEL
CO2: 28 mEq/L (ref 19–32)
Chloride: 100 mEq/L (ref 96–112)
Creatinine, Ser: 0.99 mg/dL (ref 0.50–1.35)
GFR calc Af Amer: >90 mL/min (ref >90)
Potassium: 4.2 mEq/L (ref 3.5–5.1)
Sodium: 138 mEq/L (ref 135–145)

## 2011-04-27 LAB — BASIC METABOLIC PANEL WITH GFR
BUN: 23 mg/dL (ref 6–23)
Calcium: 9.5 mg/dL (ref 8.4–10.5)
GFR calc non Af Amer: >90 mL/min (ref >90)
Glucose, Bld: 113 mg/dL — ABNORMAL HIGH (ref 70–99)

## 2011-04-27 MED ORDER — DIPHENHYDRAMINE HCL 50 MG/ML IJ SOLN
25.0000 mg | Freq: Once | INTRAMUSCULAR | Status: AC
  Administered 2011-04-27: 25 mg via INTRAVENOUS
  Filled 2011-04-27: qty 1

## 2011-04-27 MED ORDER — ZOLPIDEM TARTRATE 10 MG PO TABS
10.0000 mg | ORAL_TABLET | Freq: Every evening | ORAL | Status: DC | PRN
  Administered 2011-04-28 – 2011-05-04 (×2): 10 mg via ORAL
  Filled 2011-04-27 (×2): qty 1

## 2011-04-27 MED ORDER — ONDANSETRON HCL 4 MG/2ML IJ SOLN
4.0000 mg | INTRAMUSCULAR | Status: DC | PRN
  Administered 2011-04-30 – 2011-05-06 (×3): 4 mg via INTRAVENOUS
  Filled 2011-04-27 (×2): qty 2

## 2011-04-27 MED ORDER — SODIUM CHLORIDE 0.9 % IV SOLN
INTRAVENOUS | Status: DC
  Administered 2011-04-27 – 2011-05-06 (×14): via INTRAVENOUS

## 2011-04-27 MED ORDER — MORPHINE SULFATE 4 MG/ML IJ SOLN
2.0000 mg | INTRAMUSCULAR | Status: DC | PRN
  Administered 2011-04-28 (×2): 2 mg via INTRAVENOUS
  Filled 2011-04-27 (×2): qty 1

## 2011-04-27 MED ORDER — PAROXETINE HCL 20 MG PO TABS: 20.0000 mg | ORAL_TABLET | Freq: Every evening | ORAL | Status: AC

## 2011-04-27 MED ORDER — SODIUM CHLORIDE 0.9 % IV BOLUS (SEPSIS)
1000.0000 mL | Freq: Once | INTRAVENOUS | Status: AC
  Administered 2011-04-27: 1000 mL via INTRAVENOUS

## 2011-04-27 MED ORDER — PANTOPRAZOLE SODIUM 40 MG IV SOLR
40.0000 mg | Freq: Once | INTRAVENOUS | Status: AC
  Administered 2011-04-27: 40 mg via INTRAVENOUS
  Filled 2011-04-27: qty 40

## 2011-04-27 MED ORDER — SODIUM CHLORIDE 0.9 % IJ SOLN
3.0000 mL | Freq: Two times a day (BID) | INTRAMUSCULAR | Status: DC
  Administered 2011-04-28 – 2011-05-04 (×7): 3 mL via INTRAVENOUS

## 2011-04-27 MED ORDER — SODIUM CHLORIDE 0.9 % IV SOLN
INTRAVENOUS | Status: DC
  Administered 2011-04-29: 01:00:00 via INTRAVENOUS

## 2011-04-27 MED ORDER — PANTOPRAZOLE SODIUM 40 MG PO TBEC: 40.0000 mg | DELAYED_RELEASE_TABLET | Freq: Every day | ORAL | Status: AC

## 2011-04-27 MED ORDER — SODIUM CHLORIDE 0.9 % IV SOLN: 250.0000 mL | INTRAVENOUS | Status: DC

## 2011-04-27 MED ORDER — FENTANYL CITRATE 0.05 MG/ML IJ SOLN
50.0000 ug | Freq: Once | INTRAMUSCULAR | Status: AC
  Administered 2011-04-27: 50 ug via INTRAVENOUS
  Filled 2011-04-27: qty 2

## 2011-04-27 MED ORDER — SODIUM CHLORIDE 0.9 % IJ SOLN: 3.0000 mL | INTRAMUSCULAR | Status: DC | PRN

## 2011-04-27 MED ORDER — METOCLOPRAMIDE HCL 5 MG/ML IJ SOLN
10.0000 mg | Freq: Once | INTRAMUSCULAR | Status: AC
  Administered 2011-04-27: 10 mg via INTRAVENOUS
  Filled 2011-04-27: qty 2

## 2011-04-27 NOTE — ED Notes (Signed)
Pt presents to department for evaluation of headache, N/V, dizziness and blurred vision. Onset Sunday evening. Pt had recent ruptured disc repair by Dr. Bettina Gavia on 04/22/11. 9/10 "throbbing" pain at the time. Pt conscious alert and oriented x4. No signs of distress at the time. Wife at bedside.

## 2011-04-27 NOTE — ED Provider Notes (Signed)
History     CSN: [id number]  Arrival date & time 04/27/11  1420   First MD Initiated Contact with Patient 04/27/11 1522      Chief Complaint  Patient presents with  . Headache  . Nausea  . Emesis    (Consider location/radiation/quality/duration/timing/severity/associated sxs/prior treatment) HPI Comments: Patient had lumbar surgery on Thursday with Dr. Newell Coral. By Friday evening into Saturday, the patient began developing some mild headaches. 2 days ago the headaches have 95 to a much worse degree and is now associated with photophobia and nausea and vomiting. He does not have a history of migraine headaches. Denies fever or chills. He reports that when he is lying still the headache eases off and is only a 1-2/10 pain. However if he tries to sit up or move his head, the headache per family gets worse up to a 10 out of 10. He denies a stiff neck, skin rash. He denies chest pain or abdominal pain. Patient's spouse reports that she spoke to Dr. Newell Coral earlier who recommended that the patient lay flat on his back for the next 3 days. Have her do to his continued nausea and vomiting which appears to be getting worse as well as the episodes of severity of his headaches, she felt that it was more prudent for him to be evaluated here in emergency department.  The history is provided by the patient and the spouse.    Past Medical History  Diagnosis Date  . Pneumothorax     couple of times but didn't require a chest tube;age 86 and 26  . Chronic back pain     herniated disc,ddd,spondylosis and radiculopathy  . GERD (gastroesophageal reflux disease)     takes Dexilant daily  . Hemorrhoids   . Anxiety     takes Xanax as needed  . Depression     takes Paxil daily    Past Surgical History  Procedure Date  . Hernia repair     umbilical as a baby  . Esophagogastroduodenoscopy   . Lumbar laminectomy/decompression microdiscectomy 04/22/2011    Procedure: LUMBAR LAMINECTOMY/DECOMPRESSION  MICRODISCECTOMY;  Surgeon: Hewitt Shorts, MD;  Location: [facility];  Service: Neurosurgery;  Laterality: Left;  LEFT Lumbar five sacal one laminotomy and microdiskectomy    Family History  Problem Relation Age of Onset  . Anesthesia problems Neg Hx   . Hypotension Neg Hx   . Malignant hyperthermia Neg Hx   . Pseudochol deficiency Neg Hx     History  Substance Use Topics  . Smoking status: Current Everyday Smoker -- 1.0 packs/day for 20 years  . Smokeless tobacco: Not on file  . Alcohol Use: No      Review of Systems  Constitutional: Negative for fever and chills.  HENT: Negative for neck stiffness.   Eyes: Positive for photophobia.  Gastrointestinal: Positive for nausea and vomiting.  Musculoskeletal: Positive for back pain.  Skin: Negative for rash.  Neurological: Positive for headaches.  All other systems reviewed and are negative.    Allergies  Biaxin and Other  Home Medications   Current Outpatient Rx  Name Route Sig Dispense Refill  . ALPRAZOLAM 0.5 MG PO TABS Oral Take 0.5 mg by mouth 2 (two) times daily as needed. For sleep and anxiety    . CALCIUM CARBONATE ANTACID 750 MG PO CHEW Oral Chew 2-3 tablets by mouth 3 (three) times daily as needed. heartburn    . DEXLANSOPRAZOLE 60 MG PO CPDR Oral Take 60 mg by mouth  daily.    Marland Kitchen HYDROCODONE-ACETAMINOPHEN 5-325 MG PO TABS Oral Take 1-2 tablets by mouth every 6 (six) hours as needed. For pain    . IBUPROFEN 200 MG PO TABS Oral Take 200 mg by mouth every 6 (six) hours as needed. For pain    . MELOXICAM 7.5 MG PO TABS Oral Take 7.5 mg by mouth 2 (two) times daily.    Marland Kitchen METHOCARBAMOL 500 MG PO TABS Oral Take 500 mg by mouth 3 (three) times daily. For muscle spasms    . PAROXETINE HCL 20 MG PO TABS Oral Take 20 mg by mouth every evening.       BP 128/87  Pulse 59  Temp(Src) 97.8 F (36.6 C) (Oral)  Resp 18  SpO2 95%  Physical Exam  Nursing note and vitals reviewed. Constitutional: He is oriented to person,  place, and time. He appears well-developed and well-nourished. No distress.  HENT:  Head: Normocephalic and atraumatic.  Eyes: Conjunctivae and EOM are normal. Pupils are equal, round, and reactive to light.  Neck: Normal range of motion. Neck supple.  Cardiovascular: Normal rate and regular rhythm.   Pulmonary/Chest: Effort normal.  Abdominal: He exhibits no distension. There is no tenderness.  Musculoskeletal: He exhibits no tenderness.  Neurological: He is alert and oriented to person, place, and time. No cranial nerve deficit. Coordination normal.  Skin: Skin is warm and dry. No rash noted. He is not diaphoretic.  Psychiatric: He has a normal mood and affect.    ED Course  Procedures (including critical care time)  Labs Reviewed  CBC - Abnormal; Notable for the following:    RBC 5.84 (*)    Hemoglobin 17.2 (*)    All other components within normal limits  BASIC METABOLIC PANEL - Abnormal; Notable for the following:    Glucose, Bld 113 (*)    All other components within normal limits   No results found.   1. Post lumbar puncture headache     4:55 PM After initial dose of meds and IVF bolus, pt reports he feels much improved, no HA at present.  Will give another liter of IVF's then ambulate to see how he feels.     6PM, pt felt improved, but unable to sit up, stand or ambulate due to recurrence of HA.  I spoke to Dr. Jeral Fruit who would see pt in the ED. Pt is moved to the CDU, I discussed with PAC Laveda Norman and Dr. Juleen China as well.  Likely admission. MDM  Patient's history and clinical examination suggests that he has a process similar to a post LP headache with CSF leakage. Patient's headache is minimal when he is laying still, however with any kind of movement of his head, the headache gets much worse immediately. Patient is afebrile without rash and no neck stiffness, therefore do not feel that he has meningitis. My plan is to give him IV Reglan as well as Benadryl, some mild  analgesics such as fentanyl and will bolus him with IV fluids. Given if this is a post LP type of headache, a blood patch probably is not indicated since he just had lumbar surgery. I will need to discuss his case with Dr. Newell Coral if symptom control with the above treatments are not effective. I think he is stable to be moved to the CDU for holding to see if his symptoms do not improve in consultation with Dr. Newell Coral if symptoms are not being improved.       [name]. Fletcher Rathbun,  MD 04/28/11 5784

## 2011-04-27 NOTE — ED Notes (Signed)
Pt resting quietly at the time. Denies headache at the time. States he feels a lot better. Resting quietly at the time. Vital signs stable at the present. Wife remains at bedside. Given bedside urinal.

## 2011-04-27 NOTE — ED Provider Notes (Signed)
Patient has been seen and evaluate by Dr. [name], who requests to have patient sent over to CDU for further management. Patient has a recent back surgery who presents to the ED complaining of headache with associated nausea and vomiting. Headache is likely secondary to CSF leak. Patient has been receiving migraine cocktail provides some relief. Dr. Jeral Fruit will see patient in the ED to formulate a disposition.  9:10 PM Patient is currently in no acute distress. He rates his headache to be a 3/10. Patient requests for Protonix due to having some epigastric abdominal pain.  I will continue to monitor his care.  10:13 PM Patient has been seeing a Dr. Jeral Fruit and will be admitted. He is currently in no acute distress.  Fayrene Helper, PA-C 04/27/11 2214

## 2011-04-27 NOTE — ED Notes (Signed)
[id number] Ready 

## 2011-04-27 NOTE — ED Notes (Signed)
Midlevel at bedside evaluating patient 

## 2011-04-27 NOTE — ED Notes (Signed)
Patient was unable to walk, he stated his head still was hurting so he said he wanted to lie back down.

## 2011-04-27 NOTE — ED Notes (Signed)
Pt presents to department for evaluation of headache, blurred vision, and N/V. Onset Sunday while at home, no relief from symptoms. States he had ruptured disc repair by Dr. Bettina Gavia on 1/24. Pt states pain and nausea become worse with movement and walking. Also states blurred vision and photosensitivity. He is alert and oriented x4. No neurological deficits noted. 8/10 "throbbing" headache upon arrival to ED. No active vomiting.

## 2011-04-27 NOTE — ED Notes (Signed)
Pt able to keep fluid down, but unable to stand and walk. States his head is hurting again and movement makes it much worse.

## 2011-04-27 NOTE — ED Notes (Signed)
Pt states he is comfortable when lying flat but that HA returns when standing

## 2011-04-27 NOTE — ED Notes (Signed)
CDU full at the time. Nurse to call back. 

## 2011-04-27 NOTE — ED Notes (Signed)
Neurosurgeon at bedside evaluating patient.

## 2011-04-27 NOTE — ED Notes (Signed)
States severe headache over the past few days with nausea and vomiting. Started Saturday night. S/P laminectomy Thursday. States he was unable to get up by self due to dizziness. Rates the pain as 3/10

## 2011-04-27 NOTE — H&P (Signed)
Aaron Meyer is an 41 y.o. male.   Chief Complaint: headache HPI: patient who underwent lumbar discectomy by dr Newell Coral last week. Over the weekend he started to develop headache till the point that  became positional associated with nausea and vomiting. He called the office and was advised to rest for 24 hours Today the headache got worse as well as the nausea and his wife brought him to the er. He has been getting iv fluids . The headache is 2/10 while resting but any lifting of the head increases the pain   Past Medical History  Diagnosis Date  . Pneumothorax     couple of times but didn't require a chest tube;age 79 and 26  . Chronic back pain     herniated disc,ddd,spondylosis and radiculopathy  . GERD (gastroesophageal reflux disease)     takes Dexilant daily  . Hemorrhoids   . Anxiety     takes Xanax as needed  . Depression     takes Paxil daily    Past Surgical History  Procedure Date  . Hernia repair     umbilical as a baby  . Esophagogastroduodenoscopy   . Lumbar laminectomy/decompression microdiscectomy 04/22/2011    Procedure: LUMBAR LAMINECTOMY/DECOMPRESSION MICRODISCECTOMY;  Surgeon: Hewitt Shorts, MD;  Location: [facility];  Service: Neurosurgery;  Laterality: Left;  LEFT Lumbar five sacal one laminotomy and microdiskectomy    Family History  Problem Relation Age of Onset  . Anesthesia problems Neg Hx   . Hypotension Neg Hx   . Malignant hyperthermia Neg Hx   . Pseudochol deficiency Neg Hx    Social History:  reports that he has been smoking.  He does not have any smokeless tobacco history on file. He reports that he does not drink alcohol or use illicit drugs.  Allergies:  Allergies  Allergen Reactions  . Biaxin Nausea And Vomiting  . Other Other (See Comments)    Eggs and shrimp (hives)    Medications Prior to Admission  Medication Dose Route Frequency Provider Last Rate Last Dose  . bacitracin 16109 UNITS injection           . ceFAZolin  (ANCEF) IVPB 2 g/50 mL premix  2 g Intravenous 60 min Pre-Op Melonie Florida, MD   2 g at 04/22/11 1302  . fentaNYL (SUBLIMAZE) 0.05 MG/ML injection           . HYDROmorphone (DILAUDID) 1 MG/ML injection           . sodium chloride 0.9 % infusion           . DISCONTD: 0.9 %  sodium chloride infusion  250 mL Intravenous Continuous Hewitt Shorts, MD      . DISCONTD: 0.9 % irrigation (POUR BTL)    PRN Hewitt Shorts, MD   1,000 mL at 04/22/11 1255  . DISCONTD: acetaminophen (TYLENOL) suppository 650 mg  650 mg Rectal Q4H PRN Hewitt Shorts, MD      . DISCONTD: acetaminophen (TYLENOL) tablet 650 mg  650 mg Oral Q4H PRN Hewitt Shorts, MD      . DISCONTD: alum & mag hydroxide-simeth (MAALOX/MYLANTA) 200-200-20 MG/5ML suspension 30 mL  30 mL Oral Q6H PRN Hewitt Shorts, MD      . DISCONTD: bacitracin 50,000 Units in sodium chloride irrigation 0.9 % 500 mL irrigation    PRN Hewitt Shorts, MD      . DISCONTD: bisacodyl (DULCOLAX) suppository 10 mg  10 mg Rectal Daily  PRN Hewitt Shorts, MD      . DISCONTD: bupivacaine (MARCAINE) 0.5 % injection    PRN Hewitt Shorts, MD   5 mL at 04/22/11 1252  . DISCONTD: cyclobenzaprine (FLEXERIL) tablet 10 mg  10 mg Oral TID PRN Hewitt Shorts, MD   10 mg at 04/23/11 0003  . DISCONTD: dextrose 5 % and 0.45 % NaCl with KCl 20 mEq/L infusion   Intravenous Continuous Hewitt Shorts, MD      . DISCONTD: hemostatic agents    PRN Hewitt Shorts, MD   1 application at 04/22/11 1359  . DISCONTD: HYDROcodone-acetaminophen (NORCO) 5-325 MG per tablet 1-2 tablet  1-2 tablet Oral Q4H PRN Hewitt Shorts, MD   1 tablet at 04/23/11 0004  . DISCONTD: HYDROmorphone (DILAUDID) injection 0.25-0.5 mg  0.25-0.5 mg Intravenous Q5 min PRN Josepha Pigg, MD   0.25 mg at 04/22/11 1506  . DISCONTD: hydrOXYzine (ATARAX/VISTARIL) tablet 50 mg  50 mg Oral Q3H PRN Hewitt Shorts, MD      . DISCONTD: hydrOXYzine (VISTARIL) injection 50 mg  50 mg  Intramuscular Q3H PRN Hewitt Shorts, MD      . DISCONTD: ketorolac (TORADOL) 30 MG/ML injection 15-30 mg  15-30 mg Intravenous Once PRN Josepha Pigg, MD      . DISCONTD: ketorolac (TORADOL) 30 MG/ML injection 30 mg  30 mg Intravenous Once Hewitt Shorts, MD      . DISCONTD: ketorolac (TORADOL) 30 MG/ML injection 30 mg  30 mg Intravenous Q6H Hewitt Shorts, MD   30 mg at 04/23/11 0645  . DISCONTD: lidocaine-EPINEPHrine (XYLOCAINE W/EPI) 1 %-1:100000 (with pres) injection    PRN Hewitt Shorts, MD   5 mL at 04/22/11 1255  . DISCONTD: magnesium hydroxide (MILK OF MAGNESIA) suspension 30 mL  30 mL Oral Daily PRN Hewitt Shorts, MD      . DISCONTD: menthol-cetylpyridinium (CEPACOL) lozenge 3 mg  1 lozenge Oral PRN Hewitt Shorts, MD      . DISCONTD: meperidine (DEMEROL) injection 6.25-12.5 mg  6.25-12.5 mg Intravenous Q5 min PRN Josepha Pigg, MD      . DISCONTD: methylPREDNISolone acetate PF (DEPO-MEDROL) injection    PRN Hewitt Shorts, MD   80 mg at 04/22/11 1417  . DISCONTD: morphine 4 MG/ML injection 4-8 mg  4-8 mg Intramuscular Q3H PRN Hewitt Shorts, MD      . DISCONTD: oxyCODONE-acetaminophen (PERCOCET) 5-325 MG per tablet 1-2 tablet  1-2 tablet Oral Q4H PRN Hewitt Shorts, MD      . DISCONTD: phenol (CHLORASEPTIC) mouth spray 1 spray  1 spray Mouth/Throat PRN Hewitt Shorts, MD      . DISCONTD: promethazine (PHENERGAN) injection 6.25-12.5 mg  6.25-12.5 mg Intravenous Q15 min PRN Josepha Pigg, MD      . DISCONTD: sodium chloride 0.9 % injection 3 mL  3 mL Intravenous Q12H Hewitt Shorts, MD      . DISCONTD: sodium chloride 0.9 % injection 3 mL  3 mL Intravenous PRN Hewitt Shorts, MD   3 mL at 04/23/11 0005  . DISCONTD: thrombin spray    PRN Hewitt Shorts, MD   5,000 Units at 04/22/11 1357  . DISCONTD: zolpidem (AMBIEN) tablet 10 mg  10 mg Oral QHS PRN Hewitt Shorts, MD       No current outpatient prescriptions on file as  of 04/23/2011.    No results found for this or any  previous visit (from the past 48 hour(s)). No results found.  Review of Systems  Constitutional: Negative.   Eyes: Negative.   Respiratory: Negative.   Cardiovascular: Negative.   Gastrointestinal: Negative.   Genitourinary: Negative.   Musculoskeletal: Positive for back pain.  Skin: Negative.   Neurological: Positive for headaches.  Endo/Heme/Allergies: Negative.   Psychiatric/Behavioral: Negative.     Blood pressure 105/57, pulse 70, temperature 98.3 F (36.8 C), temperature source Oral, resp. rate 16, SpO2 94.00%. Physical Exam heent,nl. Neck, no stiffness. Cv,nl. Lungs, clear. Abdomen, nl . NEURO, normal. Wound ,flat  Assessment/Plan R/o spinal csf leak. Plan:to 3000. Mri lumbar spine in am.  Aaron Meyer 04/27/2011, 9:41 PM

## 2011-04-28 MED ORDER — HYDROXYZINE HCL 50 MG/ML IM SOLN
50.0000 mg | INTRAMUSCULAR | Status: DC | PRN
  Administered 2011-04-28: 50 mg via INTRAMUSCULAR
  Filled 2011-04-28 (×2): qty 1

## 2011-04-28 MED ORDER — HYDROXYZINE HCL 50 MG PO TABS
50.0000 mg | ORAL_TABLET | ORAL | Status: DC | PRN
  Administered 2011-04-28 – 2011-04-29 (×2): 50 mg via ORAL
  Filled 2011-04-28 (×2): qty 1

## 2011-04-28 MED ORDER — HYDROCODONE-ACETAMINOPHEN 5-325 MG PO TABS
1.0000 | ORAL_TABLET | Freq: Four times a day (QID) | ORAL | Status: DC | PRN
  Administered 2011-04-30: 1 via ORAL
  Filled 2011-04-28: qty 1

## 2011-04-28 MED ORDER — OXYCODONE-ACETAMINOPHEN 5-325 MG PO TABS
1.0000 | ORAL_TABLET | ORAL | Status: DC | PRN
  Administered 2011-04-28 – 2011-04-29 (×5): 2 via ORAL
  Administered 2011-04-30 (×2): 1 via ORAL
  Administered 2011-05-02 – 2011-05-05 (×2): 2 via ORAL
  Administered 2011-05-06: 1 via ORAL
  Administered 2011-05-06: 2 via ORAL
  Administered 2011-05-06: 1 via ORAL
  Filled 2011-04-28: qty 1
  Filled 2011-04-28 (×7): qty 2
  Filled 2011-04-28: qty 1
  Filled 2011-04-28 (×2): qty 2

## 2011-04-28 MED ORDER — HEPARIN SODIUM (PORCINE) 5000 UNIT/ML IJ SOLN
5000.0000 [IU] | Freq: Two times a day (BID) | INTRAMUSCULAR | Status: DC
  Administered 2011-04-28 – 2011-05-03 (×12): 5000 [IU] via SUBCUTANEOUS
  Filled 2011-04-28 (×14): qty 1

## 2011-04-28 MED ORDER — ALPRAZOLAM 0.5 MG PO TABS
0.5000 mg | ORAL_TABLET | Freq: Two times a day (BID) | ORAL | Status: DC
  Administered 2011-04-28 – 2011-04-29 (×3): 0.5 mg via ORAL
  Filled 2011-04-28 (×3): qty 1

## 2011-04-28 MED ORDER — PAROXETINE HCL 20 MG PO TABS
20.0000 mg | ORAL_TABLET | Freq: Every evening | ORAL | Status: DC
  Administered 2011-04-28 – 2011-05-06 (×9): 20 mg via ORAL
  Filled 2011-04-28 (×10): qty 1

## 2011-04-28 MED ORDER — MELOXICAM 7.5 MG PO TABS
7.5000 mg | ORAL_TABLET | Freq: Two times a day (BID) | ORAL | Status: DC
  Administered 2011-04-28 – 2011-05-06 (×17): 7.5 mg via ORAL
  Filled 2011-04-28 (×20): qty 1

## 2011-04-28 MED ORDER — PANTOPRAZOLE SODIUM 40 MG PO TBEC
40.0000 mg | DELAYED_RELEASE_TABLET | Freq: Every day | ORAL | Status: DC
  Administered 2011-04-28 – 2011-05-06 (×8): 40 mg via ORAL
  Filled 2011-04-28 (×8): qty 1

## 2011-04-28 NOTE — ED Provider Notes (Signed)
Medical screening examination/treatment/procedure(s) were conducted as a shared visit with non-physician practitioner(s) and myself.  I personally evaluated the patient during the encounter Please see my full H&P detailing ED course up until CDU stay Aaron Zadrozny Y.   [name]. [name], MD 04/28/11 [id number]

## 2011-04-29 MED ORDER — ALPRAZOLAM 0.5 MG PO TABS: 0.5000 mg | ORAL_TABLET | Freq: Three times a day (TID) | ORAL | Status: DC

## 2011-04-29 MED ORDER — ALPRAZOLAM 0.5 MG PO TABS
0.5000 mg | ORAL_TABLET | Freq: Three times a day (TID) | ORAL | Status: DC
  Administered 2011-04-29 – 2011-05-06 (×23): 0.5 mg via ORAL
  Filled 2011-04-29 (×25): qty 1

## 2011-04-29 NOTE — Progress Notes (Signed)
Filed Vitals:   04/28/11 1703 04/28/11 2100 04/29/11 0740 04/29/11 1114  BP: 123/80 103/65 122/77 127/84  Pulse: 60 54 59 50  Temp: 97.4 F (36.3 C) 97.6 F (36.4 C) 98 F (36.7 C) 97.9 F (36.6 C)  TempSrc: Oral Oral Oral Oral  Resp: 20 18 18 18   Height:      Weight:      SpO2: 94% 96% 96% 95%    CBC  Basename 04/27/11 1530  WBC 9.1  HGB 17.2*  HCT 48.0  PLT 166   BMET  Basename 04/27/11 1530  NA 138  K 4.2  CL 100  CO2 28  GLUCOSE 113*  BUN 23  CREATININE 0.99  CALCIUM 9.5    Patient seen yesterday and again today, with his wife. He was admitted by Dr. Jeral Fruit for treatment of headache, nausea, and vomiting. He has been placed on strict bedrest with head of bed flat, with IV fluids for support. Yesterday when we saw him we added subcutaneous heparin to the SCDs that had already been ordered for DVT prophylaxis.  His exam showed a wound is healing well there is no erythema swelling or drainage. There is no tenderness around it.  Symptomatically the patient continued to complain of headache even when laying flat, although it may have been worse when he was upright. Today he and his wife again note that he continues to have some headache, again even when laying flat.  He is voiding well. He's been using an incentive spirometer for pulmonary toilet. He is eating well.  He did have an MRI of the lumbar spine done, which have had the opportunity to review. His previous disc herniation was successfully removed. He does have some mild fluid in the surgical plane, but it's difficult to say the has clear-cut evidence of a postlaminectomy CSF leak.   Plan: Our plan is to continue on bed rest for 4 or 5 days with head of bed flat, however I've explained the patient and his wife that concerned that he continues to have headache even when flat, which would be unusual in the setting of a postlaminectomy CSF leak. Therefore I do wonder whether or not the headache is due to a  postlaminectomy CSF leak or due to some other cause. We will plan on continuing IVF for now.  Hewitt Shorts, MD 04/29/2011, 12:06 PM

## 2011-04-29 NOTE — Progress Notes (Signed)
INITIAL ADULT NUTRITION ASSESSMENT Date: 04/29/2011   Time: 12:46 PM Reason for Assessment: Nutrition Risk Report-weight loss  ASSESSMENT: Male 41 y.o.  Dx: Headache  Hx:  Past Medical History  Diagnosis Date  . Pneumothorax     couple of times but didn't require a chest tube;age 89 and 26  . Chronic back pain     herniated disc,ddd,spondylosis and radiculopathy  . GERD (gastroesophageal reflux disease)     takes Dexilant daily  . Hemorrhoids   . Anxiety     takes Xanax as needed  . Depression     takes Paxil daily    Related Meds:     . ALPRAZolam  0.5 mg Oral BID  . heparin subcutaneous  5,000 Units Subcutaneous Q12H  . meloxicam  7.5 mg Oral BID  . pantoprazole  40 mg Oral Q1200  . PARoxetine  20 mg Oral QPM  . sodium chloride  3 mL Intravenous Q12H     Ht: 6\' 2"  (188 cm)  Wt: 203 lb 4.2 oz (92.2 kg)  Ideal Wt: 80.9 kg % Ideal Wt: 114%  Usual Wt: 223 lbs % Usual Wt: 91%  Body mass index is 26.10 kg/(m^2).  Food/Nutrition Related Hx: Wife at bedside and provides most of the nutrition hx. Per wife pt hurt his back 1 month ago at which time he began to lose wt due to pain. Pt had a lumbar discectomy last week. Pt has had severe headache/N/V since the weekend. Per wife pt has only consumed fruit cocktail since Friday of last week. Per wife pt has only ate small amounts at meals since diet advanced yesterday. Pt ate 1/2 biscuit and 1 strip of bacon for breakfast this am. Pt continues to eat poorly due to pain/pain meds/lying flat. Pt meets criteria for severe malnutrition in the context of acute illness with hx of 9% wt loss x 1 month and </= 50% intake of his needs >/=5 days. Available supplements reviewed with pt/wife pt agreeable to trying Magic cups.  Labs:  CMP     Component Value Date/Time   NA 138 04/27/2011 1530   K 4.2 04/27/2011 1530   CL 100 04/27/2011 1530   CO2 28 04/27/2011 1530   GLUCOSE 113* 04/27/2011 1530   BUN 23 04/27/2011 1530   CREATININE  0.99 04/27/2011 1530   CALCIUM 9.5 04/27/2011 1530   GFRNONAA >90 04/27/2011 1530   GFRAA >90 04/27/2011 1530    CBG (last 3)  No results found for this basename: GLUCAP:3 in the last 72 hours    Intake/Output Summary (Last 24 hours) at 04/29/11 1256 Last data filed at 04/28/11 2000  Gross per 24 hour  Intake   1750 ml  Output      0 ml  Net   1750 ml     Diet Order: Regular  Supplements/Tube Feeding: none  IVF:    sodium chloride Last Rate: 125 mL/hr at 04/28/11 2000  sodium chloride   sodium chloride Last Rate: 100 mL/hr at 04/29/11 0030    Estimated Nutritional Needs:   Kcal: 2000-2200 Protein: 100-115 grams Fluid: >2 L/day  NUTRITION DIAGNOSIS: -Inadequate oral intake (NI-2.1).  Status: Ongoing  RELATED TO: pain, pain meds, pt lying flat per MD order  AS EVIDENCE BY: pt/wife report  MONITORING/EVALUATION(Goals): Goal: Pt will consume >/=90% of his estimated needs Monitor: po intake, weight  EDUCATION NEEDS: -No education needs identified at this time  INTERVENTION:  Magic cups on each meal tray. Pt instructed on how  to order from kitchen.  Dietitian [phone]  DOCUMENTATION CODES Per approved criteria  -Severe malnutrition in the context of acute illness or injury    Kendell Bane Cornelison 04/29/2011, 12:46 PM

## 2011-04-30 NOTE — Progress Notes (Signed)
Patient ID: Aaron Meyer, male   DOB: [birth year], 41 y.o.   MRN: [id number] History and physical WAS dictated and is in the recordf

## 2011-04-30 NOTE — Progress Notes (Signed)
Filed Vitals:   04/29/11 1114 04/29/11 1437 04/29/11 1748 04/29/11 2103  BP: 127/84 129/88 121/78 123/76  Pulse: 50 60 62 59  Temp: 97.9 F (36.6 C) 97.9 F (36.6 C) 97.7 F (36.5 C) 98.3 F (36.8 C)  TempSrc: Oral Oral Oral Oral  Resp: 18 18 20 20   Height:      Weight:      SpO2: 95% 95% 95% 96%    Patient continues on bedrest, with head of bed flat. Wound is healing well, there is no swelling, erythema, or drainage. The Dermabond remains intact.  Patient and his wife note that he had a good day yesterday patient notes that he had some headache last night and again this morning. His nurse Warden Fillers explains that she gave him Percocet this morning and some Zofran IV because of nausea.   Plan: Will continue on bedrest, with head of bed flat, for the next 3 days. Continuing SCDs and subcutaneous heparin for DVT prophylaxis. The patient yesterday did asked have his Xanax increased from twice a day to 3 times a day, which was ordered for him.  Hewitt Shorts, MD 04/30/2011, 8:01 AM

## 2011-05-01 MED ORDER — ALUM & MAG HYDROXIDE-SIMETH 200-200-20 MG/5ML PO SUSP
30.0000 mL | ORAL | Status: DC | PRN
  Administered 2011-05-01 – 2011-05-06 (×8): 30 mL via ORAL
  Filled 2011-05-01 (×9): qty 30

## 2011-05-01 NOTE — Progress Notes (Signed)
Filed Vitals:   04/30/11 1410 04/30/11 1759 04/30/11 2110 05/01/11 0600  BP: 118/79 100/62 100/63 123/78  Pulse: 52 57 58 53  Temp: 97.7 F (36.5 C) 98.3 F (36.8 C) 98.1 F (36.7 C) 97.7 F (36.5 C)  TempSrc: Oral Oral Oral Oral  Resp: 18 18 18 18   Height:      Weight:      SpO2: 96% 95% 94% 92%   Patient resting comfortably in bed, continues on bedrest with head of bed flat. Nurse notes that he was given some pain medication yesterday, but not overnight. Patient notes that he is feeling better, he assures me that he is using his incentive spirometer. He is voiding well. Continues on SCD and subcutaneous heparin for DVT prophylaxis.  Plan: Continue bedrest with head of bed flat for 2 more days. Had the opportunity to speak with the patient and his parents, sister, and daughter, and discussed with them his current condition, our assessment of the cause of his headaches, and her plans for continued treatment and care.  Hewitt Shorts, MD 05/01/2011, 9:42 AM

## 2011-05-02 NOTE — Progress Notes (Signed)
Subjective: Patient reports Decrease headache, essentially no headache yesterday.  Objective: Vital signs in last 24 hours: Temp:  [97.4 F (36.3 C)-98.4 F (36.9 C)] 97.5 F (36.4 C) (02/03 0630) Pulse Rate:  [51-64] 51  (02/03 0630) Resp:  [18-20] 20  (02/03 0630) BP: (95-131)/(63-83) 95/63 mmHg (02/03 0630) SpO2:  [93 %-98 %] 93 % (02/03 0630)  Intake/Output from previous day: 02/02 0701 - 02/03 0700 In: 1860 [P.O.:360; I.V.:1500] Out: -  Intake/Output this shift:    Neck is supple patient appears comfortable  Lab Results: No results found for this basename: WBC:2,HGB:2,HCT:2,PLT:2 in the last 72 hours BMET No results found for this basename: NA:2,K:2,CL:2,CO2:2,GLUCOSE:2,BUN:2,CREATININE:2,CALCIUM:2 in the last 72 hours  Studies/Results: No results found.  Assessment/Plan: Post lumbar puncture headache  LOS: 5 days  Start to mobilize patient by increasing head of bed.   Aaron Meyer J 05/02/2011, 9:56 AM

## 2011-05-03 MED ORDER — ALPRAZOLAM 0.5 MG PO TABS
0.5000 mg | ORAL_TABLET | Freq: Once | ORAL | Status: AC
  Administered 2011-05-03: 0.5 mg via ORAL

## 2011-05-03 NOTE — Progress Notes (Signed)
Filed Vitals:   05/03/11 1010 05/03/11 1200 05/03/11 1427 05/03/11 1725  BP: 119/78 119/78 125/83 115/78  Pulse: 56  62 56  Temp: 97.6 F (36.4 C)  97.8 F (36.6 C) 97.6 F (36.4 C)  TempSrc: Oral  Oral Oral  Resp: 18  18 18   Height:      Weight:      SpO2: 95%  92% 95%   Patient resting in bed with the head of bed without 13, without headache at this time. Yesterday he was able to raise been up to about 30, but he and his wife explaining that after about 45 minutes he developed headache and had to lower it back down down. Today they raised the head of bed up to about 20, but again he developed headache and again it had to be lowered back down.  Today while sitting the patient the lites are fully on and he clearly has no evidence of photophobia.  Nurse reports that the wound continues to be healing well, without any evidence of swelling.  He has been complaining about anxiety. He had been on Xanax as an outpatient prescribed by a nurse practitioner at his place of employment, because of what he describes as increased anxiety while here in the hospital we have had to increase the dose of Xanax. I've explained to he and his wife that in the long term he is going to need to have further evaluation followup regarding the anxiety. This will need to be either with a primary care physician, the [facility] mental health clinic, or a local psychiatrist. However I do not feel that we should be further increasing the dosages of his medications at this time.  Plan: I spoke with he and his wife and the nurse at length today at the bedside. The patient and his wife are concerned about the duration of his hospitalization and the question of whether he is going to require surgical intervention. They remain aspects of his symptoms that are suggestive of a postlaminectomy cerebral spinal fluid leak, however there are other aspects that are not typical of that syndrome. I've explained that before we would  consider surgical intervention we would probably want a lumbar myelogram and post volume CT scan, possibly with delayed images, however I further explained to them that there is even with a myelogram a risk of post myelogram syndrome, with spinal headache.  Our plan will be to monitor him over the next day and a half and decide whether to go ahead with a myelogram and post myelogram CT scan to further evaluate his headache.  Hewitt Shorts, MD 05/03/2011, 6:15 PM

## 2011-05-04 NOTE — Progress Notes (Signed)
Slowly raised head of bed to see how pt tolerated. From 0-15 degrees pt reported pain of 1 (on scale 0-10). From 15-20 degrees pt reported pain of 2 (on scale of 0-10). Raised HOB briefly to 25 degrees but within minutes pt's  headache worsened and he began to get dizzy. Lowered HOB down to 7 degrees and the pt quickly began feeling better.

## 2011-05-04 NOTE — Progress Notes (Signed)
Filed Vitals:   05/03/11 1725 05/03/11 2150 05/04/11 0233 05/04/11 0559  BP: 115/78 122/73 117/74 114/75  Pulse: 56 66 57 56  Temp: 97.6 F (36.4 C) 98.2 F (36.8 C) 97.5 F (36.4 C) 98.2 F (36.8 C)  TempSrc: Oral Oral Oral Oral  Resp: 18 20 20 18   Height:      Weight:      SpO2: 95% 98% 98% 96%   Patient resting comfortably flat in bed. He and his wife did raise the head of bed up further last night, but he did have increased headache after a period of time, and the bed was lowered to help relieve it.  Examining his incision it is well healed at this point, I have asked his wife to remove the remaining Dermabond.  Plan: If patient doesn't make progress with raising the head of bed and eventually mobilization because of headache, we will need to proceed with a lumbar myelogram and post myelogram CT scan to evaluate the question of a postlaminectomy headache and possible CSF leak.  I will tentatively requested a myelogram and postmyelogram CT scan for tomorrow, and in anticipation of this we will have the patient's subcutaneous heparin stopped.  Hewitt Shorts, MD 05/04/2011, 8:19 AM

## 2011-05-05 ENCOUNTER — Inpatient Hospital Stay (HOSPITAL_COMMUNITY): Payer: Worker's Compensation | Admitting: Certified Registered Nurse Anesthetist

## 2011-05-05 ENCOUNTER — Encounter (HOSPITAL_COMMUNITY): Payer: Self-pay | Admitting: Certified Registered Nurse Anesthetist

## 2011-05-05 ENCOUNTER — Encounter (HOSPITAL_COMMUNITY)
Admission: EM | Disposition: A | Discharge status: Home / Self Care | Payer: Self-pay | Source: Home / Self Care | Attending: Neurosurgery

## 2011-05-05 ENCOUNTER — Inpatient Hospital Stay (HOSPITAL_COMMUNITY): Payer: Worker's Compensation

## 2011-05-05 ENCOUNTER — Other Ambulatory Visit: Payer: Self-pay | Admitting: Diagnostic Radiology

## 2011-05-05 ENCOUNTER — Encounter (HOSPITAL_COMMUNITY): Payer: Self-pay | Admitting: Radiology

## 2011-05-05 HISTORY — PX: LUMBAR LAMINECTOMY/DECOMPRESSION MICRODISCECTOMY: SHX5026

## 2011-05-05 SURGERY — LUMBAR LAMINECTOMY/DECOMPRESSION MICRODISCECTOMY
Anesthesia: General

## 2011-05-05 MED ORDER — THROMBIN 5000 UNITS EX SOLR
CUTANEOUS | Status: DC | PRN
  Administered 2011-05-05: 2 [IU] via TOPICAL

## 2011-05-05 MED ORDER — PROPOFOL 10 MG/ML IV BOLUS
INTRAVENOUS | Status: DC | PRN
  Administered 2011-05-05: 200 mg via INTRAVENOUS

## 2011-05-05 MED ORDER — HYDROMORPHONE HCL PF 1 MG/ML IJ SOLN
0.2500 mg | INTRAMUSCULAR | Status: DC | PRN
  Administered 2011-05-05: 0.5 mg via INTRAVENOUS

## 2011-05-05 MED ORDER — HYDROMORPHONE HCL PF 1 MG/ML IJ SOLN
INTRAMUSCULAR | Status: AC
  Filled 2011-05-05: qty 1

## 2011-05-05 MED ORDER — BACITRACIN 50000 UNITS IM SOLR
INTRAMUSCULAR | Status: AC
  Filled 2011-05-05: qty 1

## 2011-05-05 MED ORDER — MIDAZOLAM HCL 5 MG/5ML IJ SOLN
INTRAMUSCULAR | Status: DC | PRN
  Administered 2011-05-05: 2 mg via INTRAVENOUS

## 2011-05-05 MED ORDER — ROCURONIUM BROMIDE 100 MG/10ML IV SOLN
INTRAVENOUS | Status: DC | PRN
  Administered 2011-05-05: 50 mg via INTRAVENOUS

## 2011-05-05 MED ORDER — LIDOCAINE HCL 4 % MT SOLN
OROMUCOSAL | Status: DC | PRN
  Administered 2011-05-05: 5 mL via TOPICAL

## 2011-05-05 MED ORDER — PROMETHAZINE HCL 25 MG/ML IJ SOLN
6.2500 mg | INTRAMUSCULAR | Status: DC | PRN
  Filled 2011-05-05: qty 1

## 2011-05-05 MED ORDER — LACTATED RINGERS IV SOLN
INTRAVENOUS | Status: DC | PRN
  Administered 2011-05-05 (×2): via INTRAVENOUS

## 2011-05-05 MED ORDER — GLYCOPYRROLATE 0.2 MG/ML IJ SOLN
INTRAMUSCULAR | Status: DC | PRN
  Administered 2011-05-05: .7 mg via INTRAVENOUS

## 2011-05-05 MED ORDER — ALPRAZOLAM 0.25 MG PO TABS
ORAL_TABLET | ORAL | Status: AC
  Filled 2011-05-05: qty 2

## 2011-05-05 MED ORDER — BUPIVACAINE HCL (PF) 0.5 % IJ SOLN
INTRAMUSCULAR | Status: DC | PRN
  Administered 2011-05-05: 30 mL

## 2011-05-05 MED ORDER — ONDANSETRON HCL 4 MG/2ML IJ SOLN
INTRAMUSCULAR | Status: DC | PRN
  Administered 2011-05-05: 4 mg via INTRAVENOUS

## 2011-05-05 MED ORDER — LIDOCAINE-EPINEPHRINE 1 %-1:100000 IJ SOLN
INTRAMUSCULAR | Status: DC | PRN
  Administered 2011-05-05: 16 mL via INTRADERMAL

## 2011-05-05 MED ORDER — SODIUM CHLORIDE 0.9 % IR SOLN
Status: DC | PRN
  Administered 2011-05-05: 18:00:00

## 2011-05-05 MED ORDER — FENTANYL CITRATE 0.05 MG/ML IJ SOLN
INTRAMUSCULAR | Status: DC | PRN
  Administered 2011-05-05 (×2): 100 ug via INTRAVENOUS
  Administered 2011-05-05: 50 ug via INTRAVENOUS

## 2011-05-05 MED ORDER — VECURONIUM BROMIDE 10 MG IV SOLR
INTRAVENOUS | Status: DC | PRN
  Administered 2011-05-05: 3 mg via INTRAVENOUS

## 2011-05-05 MED ORDER — CEFAZOLIN SODIUM 1-5 GM-% IV SOLN
1.0000 g | Freq: Once | INTRAVENOUS | Status: AC
  Administered 2011-05-05: 1 g via INTRAVENOUS
  Filled 2011-05-05: qty 50

## 2011-05-05 MED ORDER — IOHEXOL 180 MG/ML  SOLN
20.0000 mL | Freq: Once | INTRAMUSCULAR | Status: AC | PRN
  Administered 2011-05-05: 10 mL via INTRATHECAL

## 2011-05-05 MED ORDER — OXYCODONE-ACETAMINOPHEN 5-325 MG PO TABS
ORAL_TABLET | ORAL | Status: AC
  Administered 2011-05-05: 2 via ORAL
  Filled 2011-05-05: qty 2

## 2011-05-05 MED ORDER — NEOSTIGMINE METHYLSULFATE 1 MG/ML IJ SOLN
INTRAMUSCULAR | Status: DC | PRN
  Administered 2011-05-05: 4 mg via INTRAVENOUS

## 2011-05-05 MED ORDER — CEFAZOLIN SODIUM 1-5 GM-% IV SOLN
INTRAVENOUS | Status: AC
  Filled 2011-05-05: qty 50

## 2011-05-05 MED ORDER — SODIUM CHLORIDE 0.9 % IV SOLN
INTRAVENOUS | Status: AC
  Filled 2011-05-05: qty 500

## 2011-05-05 SURGICAL SUPPLY — 59 items
ADH SKN CLS APL DERMABOND .7 (GAUZE/BANDAGES/DRESSINGS)
APL SKNCLS STERI-STRIP NONHPOA (GAUZE/BANDAGES/DRESSINGS)
BAG DECANTER FOR FLEXI CONT (MISCELLANEOUS) ×2 IMPLANT
BENZOIN TINCTURE PRP APPL 2/3 (GAUZE/BANDAGES/DRESSINGS) IMPLANT
BLADE SURG ROTATE 9660 (MISCELLANEOUS) IMPLANT
BRUSH SCRUB EZ PLAIN DRY (MISCELLANEOUS) ×2 IMPLANT
BUR ACORN 6.0 ACORN (BURR) IMPLANT
BUR ACRON 5.0MM COATED (BURR) IMPLANT
BUR MATCHSTICK NEURO 3.0 LAGG (BURR) ×2 IMPLANT
CANISTER SUCTION 2500CC (MISCELLANEOUS) ×2 IMPLANT
CLOTH BEACON ORANGE TIMEOUT ST (SAFETY) ×2 IMPLANT
CONT SPEC 4OZ CLIKSEAL STRL BL (MISCELLANEOUS) IMPLANT
DERMABOND ADVANCED (GAUZE/BANDAGES/DRESSINGS)
DERMABOND ADVANCED .7 DNX12 (GAUZE/BANDAGES/DRESSINGS) IMPLANT
DRAPE LAPAROTOMY 100X72X124 (DRAPES) ×2 IMPLANT
DRAPE MICROSCOPE LEICA (MISCELLANEOUS) ×2 IMPLANT
DRAPE POUCH INSTRU U-SHP 10X18 (DRAPES) ×2 IMPLANT
DRSG EMULSION OIL 3X3 NADH (GAUZE/BANDAGES/DRESSINGS) IMPLANT
DURAFORM COLLAGEN 1X1 5-PACK (Neuro Prosthesis/Implant) ×1 IMPLANT
ELECT REM PT RETURN 9FT ADLT (ELECTROSURGICAL) ×2
ELECTRODE REM PT RTRN 9FT ADLT (ELECTROSURGICAL) ×1 IMPLANT
GAUZE SPONGE 4X4 16PLY XRAY LF (GAUZE/BANDAGES/DRESSINGS) IMPLANT
GLOVE BIOGEL PI IND STRL 8 (GLOVE) ×1 IMPLANT
GLOVE BIOGEL PI INDICATOR 8 (GLOVE) ×2
GLOVE ECLIPSE 7.5 STRL STRAW (GLOVE) ×3 IMPLANT
GLOVE EXAM NITRILE LRG STRL (GLOVE) IMPLANT
GLOVE EXAM NITRILE MD LF STRL (GLOVE) IMPLANT
GLOVE EXAM NITRILE XL STR (GLOVE) IMPLANT
GLOVE EXAM NITRILE XS STR PU (GLOVE) IMPLANT
GOWN BRE IMP SLV AUR LG STRL (GOWN DISPOSABLE) ×3 IMPLANT
GOWN BRE IMP SLV AUR XL STRL (GOWN DISPOSABLE) ×1 IMPLANT
GOWN STRL REIN 2XL LVL4 (GOWN DISPOSABLE) IMPLANT
KIT BASIN OR (CUSTOM PROCEDURE TRAY) ×2 IMPLANT
KIT ROOM TURNOVER OR (KITS) ×2 IMPLANT
NDL HYPO 18GX1.5 BLUNT FILL (NEEDLE) IMPLANT
NDL SPNL 18GX3.5 QUINCKE PK (NEEDLE) ×1 IMPLANT
NDL SPNL 22GX3.5 QUINCKE BK (NEEDLE) ×1 IMPLANT
NEEDLE HYPO 18GX1.5 BLUNT FILL (NEEDLE) IMPLANT
NEEDLE SPNL 18GX3.5 QUINCKE PK (NEEDLE) ×2 IMPLANT
NEEDLE SPNL 22GX3.5 QUINCKE BK (NEEDLE) ×2 IMPLANT
NS IRRIG 1000ML POUR BTL (IV SOLUTION) ×2 IMPLANT
PACK LAMINECTOMY NEURO (CUSTOM PROCEDURE TRAY) ×2 IMPLANT
PAD ARMBOARD 7.5X6 YLW CONV (MISCELLANEOUS) ×6 IMPLANT
PATTIES SURGICAL .5 X1 (DISPOSABLE) ×1 IMPLANT
RUBBERBAND STERILE (MISCELLANEOUS) ×4 IMPLANT
SPONGE GAUZE 4X4 12PLY (GAUZE/BANDAGES/DRESSINGS) IMPLANT
SPONGE LAP 4X18 X RAY DECT (DISPOSABLE) IMPLANT
SPONGE SURGIFOAM ABS GEL SZ50 (HEMOSTASIS) ×2 IMPLANT
STRIP CLOSURE SKIN 1/2X4 (GAUZE/BANDAGES/DRESSINGS) IMPLANT
SUT PROLENE 6 0 BV (SUTURE) ×3 IMPLANT
SUT VIC AB 1 CT1 18XBRD ANBCTR (SUTURE) ×1 IMPLANT
SUT VIC AB 1 CT1 8-18 (SUTURE) ×2
SUT VIC AB 2-0 CP2 18 (SUTURE) ×2 IMPLANT
SUT VIC AB 3-0 SH 8-18 (SUTURE) IMPLANT
SYR 20CC LL (SYRINGE) ×2 IMPLANT
SYR 5ML LL (SYRINGE) IMPLANT
TOWEL OR 17X24 6PK STRL BLUE (TOWEL DISPOSABLE) ×2 IMPLANT
TOWEL OR 17X26 10 PK STRL BLUE (TOWEL DISPOSABLE) ×2 IMPLANT
WATER STERILE IRR 1000ML POUR (IV SOLUTION) ×2 IMPLANT

## 2011-05-05 NOTE — Anesthesia Postprocedure Evaluation (Signed)
Anesthesia Post Note  Patient: Aaron Meyer  Procedure(s) Performed:  LUMBAR LAMINECTOMY/DECOMPRESSION MICRODISCECTOMY - lumbar laminotomy and repair of Cerebrospinal Fluid Leak  Anesthesia type: general  Patient location: PACU  Post pain: Pain level controlled  Post assessment: Patient's Cardiovascular Status Stable  Last Vitals:  Filed Vitals:   05/05/11 1945  BP: 124/80  Pulse: 58  Temp:   Resp: 11    Post vital signs: Reviewed and stable  Level of consciousness: sedated  Complications: No apparent anesthesia complications

## 2011-05-05 NOTE — Progress Notes (Signed)
Nutrition Follow-up  Diet Order:  NPO  Meds: Scheduled Meds:   . ALPRAZolam      . ALPRAZolam  0.5 mg Oral TID  . meloxicam  7.5 mg Oral BID  . pantoprazole  40 mg Oral Q1200  . PARoxetine  20 mg Oral QPM  . sodium chloride  3 mL Intravenous Q12H   Continuous Infusions:   . sodium chloride 75 mL/hr at 05/05/11 0628  . sodium chloride     PRN Meds:.alum & mag hydroxide-simeth, HYDROcodone-acetaminophen, hydrOXYzine, hydrOXYzine, iohexol, morphine, ondansetron (ZOFRAN) IV, oxyCODONE-acetaminophen, sodium chloride, zolpidem  Labs:  CMP     Component Value Date/Time   NA 138 04/27/2011 1530   K 4.2 04/27/2011 1530   CL 100 04/27/2011 1530   CO2 28 04/27/2011 1530   GLUCOSE 113* 04/27/2011 1530   BUN 23 04/27/2011 1530   CREATININE 0.99 04/27/2011 1530   CALCIUM 9.5 04/27/2011 1530   GFRNONAA >90 04/27/2011 1530   GFRAA >90 04/27/2011 1530     Intake/Output Summary (Last 24 hours) at 05/05/11 1001 Last data filed at 05/05/11 4098  Gross per 24 hour  Intake    900 ml  Output      0 ml  Net    900 ml   Per chart pt continues to not tolerate elevated HOB due to increase in pain. Pt is NPO today for procedure and is currently off the floor.   Weight Status:  No new weight  Re-estimated needs:    Kcal: 2000-2200  Protein: 100-115 grams  Fluid: >2 L/day  Nutrition Dx:  -Inadequate oral intake (NI-2.1). Status: Ongoing  Goal:  Pt will consume >/=90% of his estimated needs; not met  Intervention:     Continue to provide magic cup with meals  Encourage pt to order meals  Monitor:  po intake, weight  Kendell Bane Cornelison Pager #:  [phone]

## 2011-05-05 NOTE — Transfer of Care (Signed)
Immediate Anesthesia Transfer of Care Note  Patient: Aaron Meyer  Procedure(s) Performed:  LUMBAR LAMINECTOMY/DECOMPRESSION MICRODISCECTOMY - lumbar laminotomy and repair of Cerebrospinal Fluid Leak  Patient Location: PACU  Anesthesia Type: General  Level of Consciousness: awake, alert  and oriented  Airway & Oxygen Therapy: Patient Spontanous Breathing and Patient connected to nasal cannula oxygen  Post-op Assessment: Report given to PACU RN and Post -op Vital signs reviewed and stable  Post vital signs: Reviewed and stable  Complications: No apparent anesthesia complications

## 2011-05-05 NOTE — Progress Notes (Signed)
Filed Vitals:   05/04/11 2200 05/05/11 0200 05/05/11 0600 05/05/11 0821  BP: 117/76 118/78 127/77 114/78  Pulse: 71 60 57 91  Temp: 98.4 F (36.9 C) 99 F (37.2 C) 97.8 F (36.6 C) 97.9 F (36.6 C)  TempSrc:    Oral  Resp: 18 18 18 18   Height:      Weight:      SpO2: 98% 96% 97% 96%   Patient underwent a lumbar myelogram and post Monogram CT scan today by Dr. Wendall Stade. I discussed the results with Dr. Karin Golden agreed and have reviewed the images, and a reviewed the findings with the patient and his wife at length at his bedside. This study confirms a cerebrospinal fluid leak, it appears to emanate from a superior medial bony edge of his laminotomy, which appears to have lacerated the dorsal aspect of the dural sac.  I discussed with the patient and his wife that with the lack of improvement with a week of bed rest, but I feel reexploration and repair of the CSF leak to be necessary.  We discussed the nature of his condition in nature the procedure, we discussed risks including risks of infection, bleeding, nerve dysfunction, recurrent CSF leak, and anesthetic risks. Their questions were answered and he would like to proceed as soon as feasible and we'll plan to schedule that for later today. Orders have been written.   Plan: Lumbar laminotomy and repair of cerebrospinal fluid leak.  Hewitt Shorts, MD 05/05/2011, 1:11 PM

## 2011-05-05 NOTE — Op Note (Signed)
04/27/2011 - 05/05/2011  6:00 PM  PATIENT:  Aaron Meyer  41 y.o. male  PRE-OPERATIVE DIAGNOSIS:  post laminectomy CSF leak  POST-OPERATIVE DIAGNOSIS:  Post laminectomy CSF leak  PROCEDURE:  Procedure(s): Superior S1 laminotomy and repair of CSF leak with the operating microscope, microdissection, and microsurgical technique  SURGEON:  Surgeon(s): Hewitt Shorts, MD  ASSISTANTS: Coletta Memos, M.D.  ANESTHESIA:   general  EBL:  Total I/O In: 1000 [I.V.:1000] Out: -   BLOOD ADMINISTERED:none  COUNT: Correct per nursing staff  DICTATION: Patient was brought to the operating room placed under general endotracheal anesthesia. Patient was turned to a prone position. Lumbar region was prepped with Betadine soap and solution and draped in a sterile fashion. The previous midline incision was reopened and CSF immediately drained into the exposure. The fascial sutures were incised and the paraspinal muscle was retracted laterally a self-retaining retractor placed. The operating microscope was draped and brought in the field provided additional magnification illumination and visualization the remainder the surgery was performed using micro-dissection and microsurgical technique.  CSF was seen to drain into the laminotomy and we explored the laminotomy and found a hole in the dura in the inferior aspect of the laminotomy and there was an edge of the superior aspect of S1 that had caused erosion into the dura and the hole in the dura and the CSF leak. The dural defect was in the dorsolateral aspect of the thecal sac.  We extended the left superior S1 laminotomy, so as to expose the inferior aspect of the dural defect. We defined the edges the dura and the superior and inferior aspects of the dural defect. The repair was done with 3 interrupted 6-0 Prolene sutures, placed in the first suture in the midportion of the defect, tying it down, and then placing another suture above in another a below.  Good approximation of the dura was achieved and no further CSF leakage was seen. The patient was Valsalva by the anesthesia service and no CSF leakage was seen. We then placed a piece of dura foam over the dural repair and proceeded with closure.  We injected local anesthetic with epinephrine into the subcutaneous tissues bilaterally. The deep fascia was closed with interrupted 1 undyed Vicryl sutures, Scarpa's fascia was closed with interrupted inverted 1 undyed Vicryl suture, and the subcutaneous and subcuticular closed with interrupted inverted 2-0 undyed Vicryl sutures. The skin edges were approximated with Dermabond.  Following surgery the patient is to be turned back to the supine position, to be reversed from the anesthetic and extubated and transferred to the recovery room for further care.  PLAN OF CARE: Admit to inpatient   PATIENT DISPOSITION:  PACU - hemodynamically stable.   Delay start of Pharmacological VTE agent (>24hrs) due to surgical blood loss or risk of bleeding:  yes

## 2011-05-05 NOTE — Anesthesia Procedure Notes (Signed)
Procedure Name: Intubation Date/Time: 05/05/2011 4:45 PM Performed by: Delbert Harness Pre-anesthesia Checklist: Patient identified, Timeout performed, Emergency Drugs available, Suction available and Patient being monitored Patient Re-evaluated:Patient Re-evaluated prior to inductionOxygen Delivery Method: Circle System Utilized Preoxygenation: Pre-oxygenation with 100% oxygen Intubation Type: IV induction Ventilation: Mask ventilation without difficulty and Oral airway inserted - appropriate to patient size Laryngoscope Size: Mac and 4 Grade View: Grade I Tube type: Oral Tube size: 7.5 mm Number of attempts: 1 Airway Equipment and Method: stylet and LTA Placement Confirmation: ETT inserted through vocal cords under direct vision,  positive ETCO2 and breath sounds checked- equal and bilateral Secured at: 21 cm Tube secured with: Tape Dental Injury: Teeth and Oropharynx as per pre-operative assessment

## 2011-05-05 NOTE — Anesthesia Preprocedure Evaluation (Addendum)
Anesthesia Evaluation  Patient identified by MRN, date of birth, ID band Patient awake    Reviewed: Allergy & Precautions, H&P , NPO status , Patient's Chart, lab work & pertinent test results  History of Anesthesia Complications Negative for: history of anesthetic complications  Airway Mallampati: II TM Distance: >3 FB Neck ROM: Full    Dental No notable dental hx. (+) Teeth Intact and Dental Advisory Given   Pulmonary Current Smoker,  clear to auscultation  Pulmonary exam normal       Cardiovascular neg cardio ROS Regular Normal    Neuro/Psych Anxiety Depression    GI/Hepatic Neg liver ROS, GERD-  Medicated and Controlled,  Endo/Other  Negative Endocrine ROS  Renal/GU negative Renal ROS     Musculoskeletal   Abdominal Normal abdominal exam  (+)   Peds  Hematology   Anesthesia Other Findings   Reproductive/Obstetrics                          Anesthesia Physical Anesthesia Plan  ASA: II  Anesthesia Plan: General   Post-op Pain Management:    Induction: Intravenous  Airway Management Planned: Oral ETT  Additional Equipment:   Intra-op Plan:   Post-operative Plan: Extubation in OR  Informed Consent: I have reviewed the patients History and Physical, chart, labs and discussed the procedure including the risks, benefits and alternatives for the proposed anesthesia with the patient or authorized representative who has indicated his/her understanding and acceptance.   Dental advisory given  Plan Discussed with: CRNA and Surgeon  Anesthesia Plan Comments: (Plan routine monitors, GETA)        Anesthesia Quick Evaluation

## 2011-05-05 NOTE — Progress Notes (Signed)
Filed Vitals:   05/05/11 0600 05/05/11 0821 05/05/11 1430 05/05/11 1815  BP: 127/77 114/78 117/73 127/84  Pulse: 57 91 85 67  Temp: 97.8 F (36.6 C) 97.9 F (36.6 C) 98.1 F (36.7 C) 98 F (36.7 C)  TempSrc:  Oral Oral   Resp: 18 18 17 9   Height:      Weight:      SpO2: 97% 96% 97% 99%   Awakening in PACU. Wound clean and dry. Moving all extremities well. No voided yet.  Plan: For transfer to floor once fully awake from anesthesia. We'll plan to begin to mobilize tomorrow.  Hewitt Shorts, MD 05/05/2011, 6:20 PM

## 2011-05-05 NOTE — Preoperative (Signed)
Beta Blockers   Reason not to administer Beta Blockers:Not Applicable 

## 2011-05-06 MED ORDER — ACETAMINOPHEN 325 MG PO TABS
650.0000 mg | ORAL_TABLET | ORAL | Status: DC | PRN
  Administered 2011-05-06: 650 mg via ORAL
  Filled 2011-05-06: qty 2

## 2011-05-06 NOTE — Progress Notes (Signed)
Utilization review completed. Erial Fikes, RN, BSN.  05/06/11 

## 2011-05-06 NOTE — Progress Notes (Signed)
Filed Vitals:   05/05/11 2000 05/05/11 2200 05/06/11 0200 05/06/11 0600  BP: 128/76 124/62 112/72 95/60  Pulse: 60 73 65 58  Temp: 97.8 F (36.6 C) 97.9 F (36.6 C) 97.1 F (36.2 C) 97.2 F (36.2 C)  TempSrc:      Resp: 12 18 18 18   Height:      Weight:      SpO2: 97% 97% 93% 96%   Patient notes good relief of headache, but he does say he feels a bit foggy from the anesthesia still. Wound is clean and dry without swelling erythema or drainage. Voiding well.  Plan: We'll begin to mobilize today and monitor progress. Patient requested Tylenol for for headache, which is ordered.  Hewitt Shorts, MD 05/06/2011, 7:49 AM

## 2011-05-06 NOTE — Progress Notes (Signed)
Filed Vitals:   05/05/11 2200 05/06/11 0200 05/06/11 0600 05/06/11 1045  BP: 124/62 112/72 95/60 120/78  Pulse: 73 65 58 56  Temp: 97.9 F (36.6 C) 97.1 F (36.2 C) 97.2 F (36.2 C) 98.8 F (37.1 C)  TempSrc:    Oral  Resp: 18 18 18 18   Height:      Weight:      SpO2: 97% 93% 96% 96%   Patient is progressed through mobilization today, and has ambulated a short distance in the [place]. He had some mild nausea and also some mild lightheadedness, I expect these to improve as he progresses his mobility.  Plan: Continued to mobilize, I've encouraged him to ambulate at least twice more later this afternoon and this evening.  Hewitt Shorts, MD 05/06/2011, 2:16 PM

## 2011-05-07 ENCOUNTER — Encounter (HOSPITAL_COMMUNITY): Payer: Self-pay | Admitting: Neurosurgery

## 2011-05-07 MED ORDER — OXYCODONE-ACETAMINOPHEN 5-325 MG PO TABS: 1.0000 | ORAL_TABLET | ORAL | Status: AC | PRN

## 2011-05-07 NOTE — Progress Notes (Signed)
Discharge teaching done. Pt discharged home with family. Pt at baseline and doing well.

## 2011-05-07 NOTE — Discharge Summary (Signed)
Physician Discharge Summary  Patient ID: Aaron Meyer MRN: [id number] DOB/AGE: 43-Jul-1972 43 y.o.  Admit date: 04/27/2011 Discharge date: 05/07/2011  Admission Diagnoses: Post laminectomy headache  Discharge Diagnoses: Postlaminectomy cerebral spinal fluid leak  Discharged Condition: good  Hospital Course: Patient was readmitted 5 days after a left L5-S1 lumbar laminotomy and microdiscectomy because of headaches, nausea, vomiting, and dehydration. His wound was found to be healing well with no swelling, erythema, or drainage. Patient was started an IV fluids, SCDs were placed, and he was started on subcutaneous heparin. He was studied with MRI scan which showed small amount of fluid in the surgical tract, but not clear evidence of a cerebrospinal fluid leak. Further headache did not fully resolve with being flat in bed.  It was decided to give a trial of strict bedrest with the head of bed flat, with IV fluid support, but the headache did not resolve and 2 days ago he was studied with lumbar myelogram and post myelogram CT scan which confirmed a cerebrospinal fluid leak. He was therefore taken to surgery that evening for a superior S1 laminotomy and repair as of cerebrospinal fluid leak.  He is done well following this surgery with complete resolution of his post laminectomy headache. He is up and ambulating in the halls. His wound is healing well. There is no erythema, swelling, or drainage. The Dermabond is in place.  He and his wife have been given instructions regarding wound care, activities following discharge, and followup.  Discharge Exam: Blood pressure 100/67, pulse 79, temperature 97.8 F (36.6 C), temperature source Axillary, resp. rate 18, height 6\' 2"  (1.88 m), weight 92.2 kg (203 lb 4.2 oz), SpO2 96.00%.  Disposition: Home   Medication List  As of 05/07/2011  7:47 AM   TAKE these medications         ALPRAZolam 0.5 MG tablet   Commonly known as: XANAX   Take 0.5 mg by  mouth 2 (two) times daily as needed. For sleep and anxiety      calcium carbonate 750 MG chewable tablet   Commonly known as: TUMS EX   Chew 2-3 tablets by mouth 3 (three) times daily as needed. heartburn      dexlansoprazole 60 MG capsule   Commonly known as: DEXILANT   Take 60 mg by mouth daily.      HYDROcodone-acetaminophen 5-325 MG per tablet   Commonly known as: NORCO   Take 1-2 tablets by mouth every 6 (six) hours as needed. For pain      ibuprofen 200 MG tablet   Commonly known as: ADVIL,MOTRIN   Take 200 mg by mouth every 6 (six) hours as needed. For pain      meloxicam 7.5 MG tablet   Commonly known as: MOBIC   Take 7.5 mg by mouth 2 (two) times daily.      methocarbamol 500 MG tablet   Commonly known as: ROBAXIN   Take 500 mg by mouth 3 (three) times daily. For muscle spasms      oxyCODONE-acetaminophen 5-325 MG per tablet   Commonly known as: PERCOCET   Take 1-2 tablets by mouth every 4 (four) hours as needed for pain.      PARoxetine 20 MG tablet   Commonly known as: PAXIL   Take 20 mg by mouth every evening.             Signed: Hewitt Shorts, MD 05/07/2011, 7:47 AM

## 2012-04-02 IMAGING — CT CT L SPINE W/ CM
4 of 5 series · 16 of 33 positions shown, 18 images · IV contrast (omnipaque)
Comparison: MRI 04/27/2011

CLINICAL DATA: Severe positional headache after left
hemilaminectomy at L5-S1

  MYELOGRAM INJECTION
TECHNIQUE: Informed consent was obtained from the patient prior to
the procedure, including potential complications of headache,
allergy, infection and pain.  A timeout procedure was performed.
With the patient prone, the lower back was prepped with Betadine.
1% Lidocaine was used for local anesthesia.  Lumbar puncture was
performed at the right L2-3 level using a 22 gauge needle with
return of clear CSF.  14 ml of Omnipaque 913was injected into the
subarachnoid space . At the time of filming, it was evident that
this was a mixed subdural and intrathecal injection.  Luckily, I do
not see any epidural contrast.  Therefore, CT result should be
valid.
TECHNIQUE: Following injection of intrathecal Omnipaque contrast,
spine imaging in multiple projections was performed using
fluoroscopy.
Fluoroscopy Time: 1.0 minutes.
TECHNIQUE: CT imaging of the lumbar spine was performed after
intrathecal contrast administration.  Multiplanar CT image
reconstructions were also generated.

[Series 3: soft tissue · axial · 0.41mm/px · z∈[-397,-277]mm · 6 of 84 slices shown]
[im 12/84  soft-tissue]
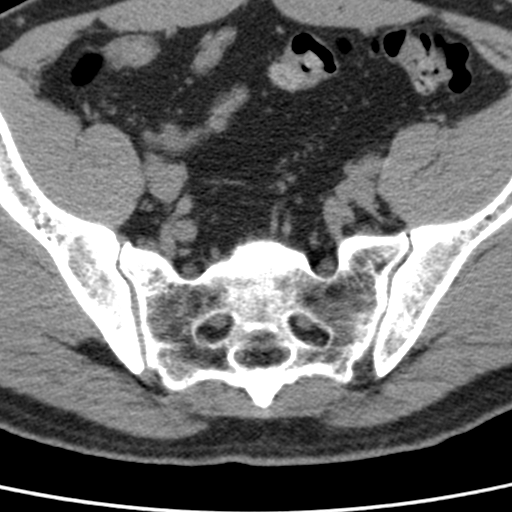
[im 24/84  soft-tissue]
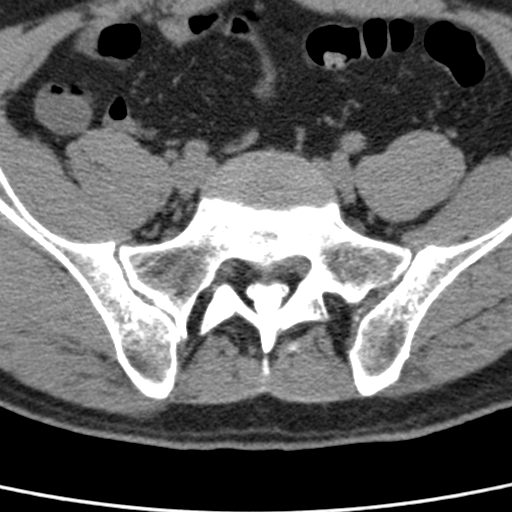
[im 36/84  soft-tissue]
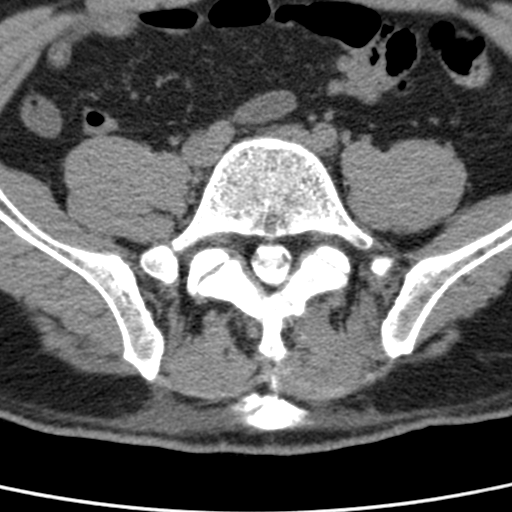
[im 48/84  soft-tissue]
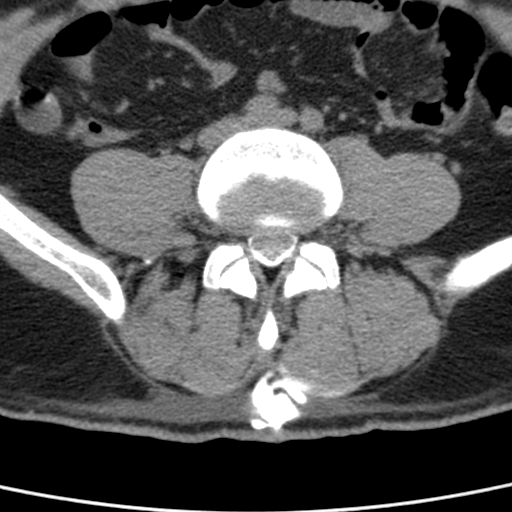
[im 60/84  soft-tissue]
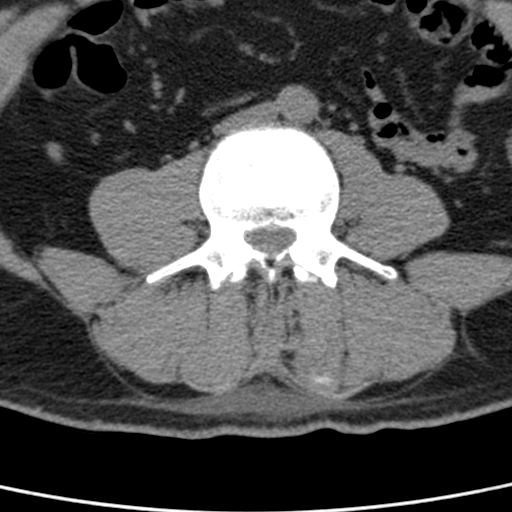
[im 72/84  soft-tissue]
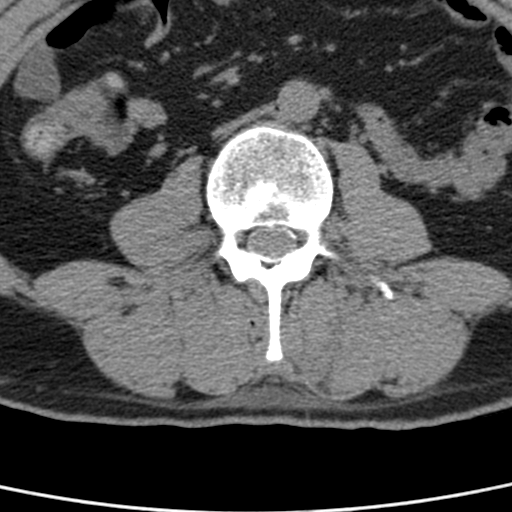

[mpr, coronal bone · coronal · 0.43mm/px · 1 of 74 slices shown]
[im 37/74  bone]
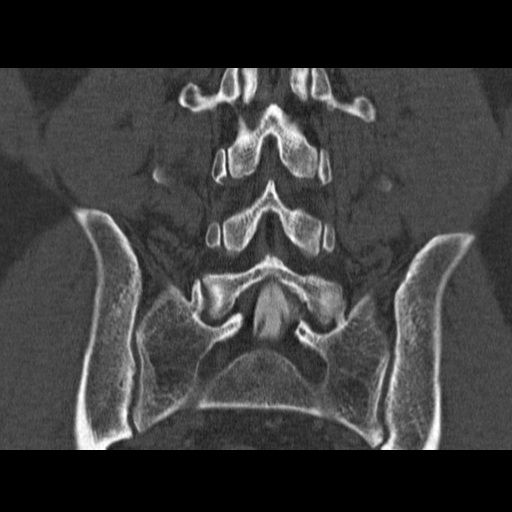

[mpr, sagittal st, sagittal · sagittal · 0.41mm/px · 5 of 66 slices shown, 6 images]
[im 22/66  bone]
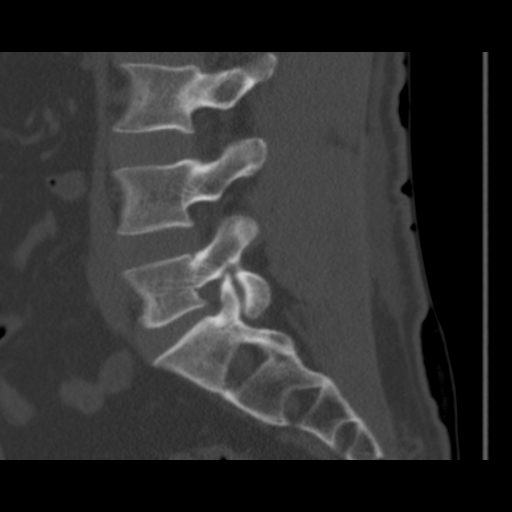
[im 28/66  bone]
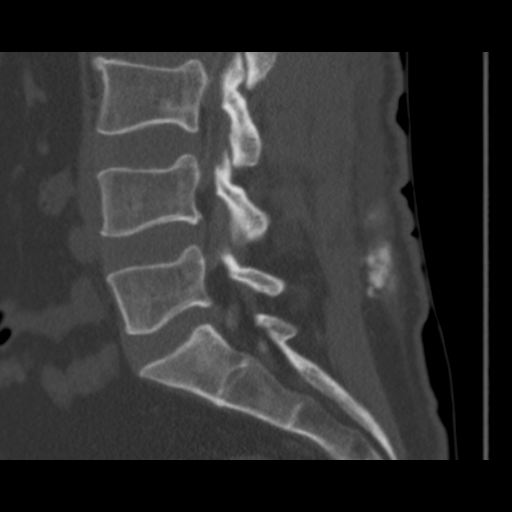
[im 33/66  soft-tissue]
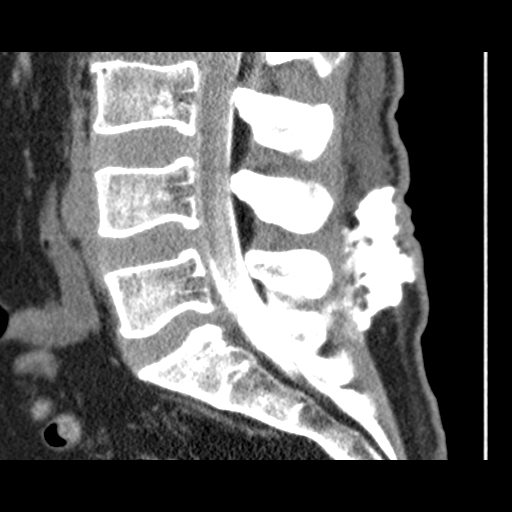
[im 33/66  bone]
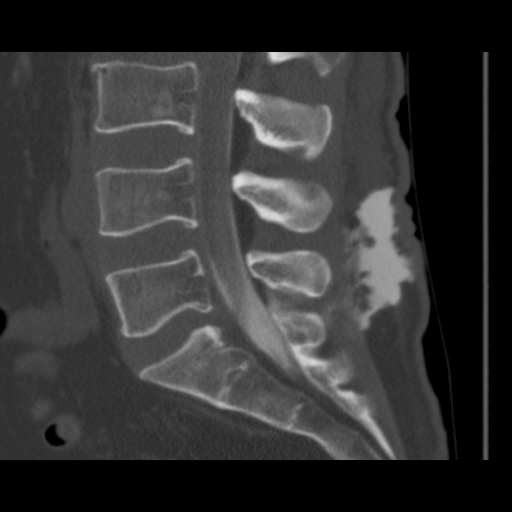
[im 38/66  bone]
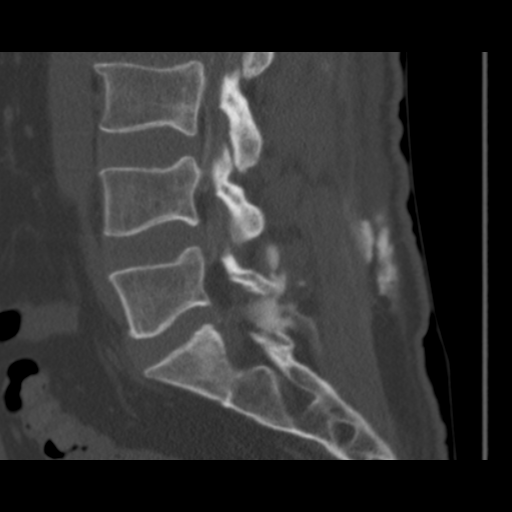
[im 44/66  bone]
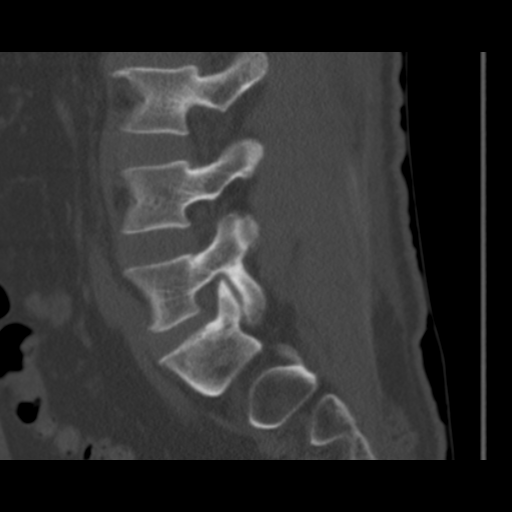

[orthogs · axial · 0.41mm/px · z∈[-411,-342]mm · 4 of 62 slices shown, 5 images]
[im 13/62  soft-tissue]
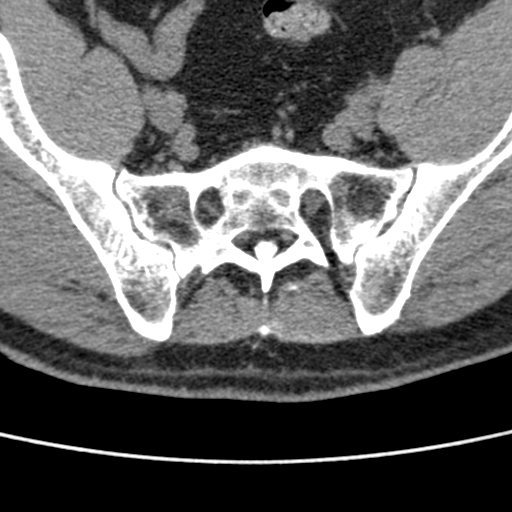
[im 13/62  bone]
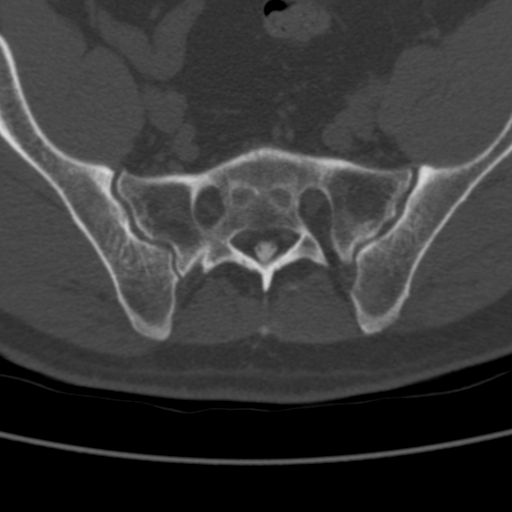
[im 25/62  bone]
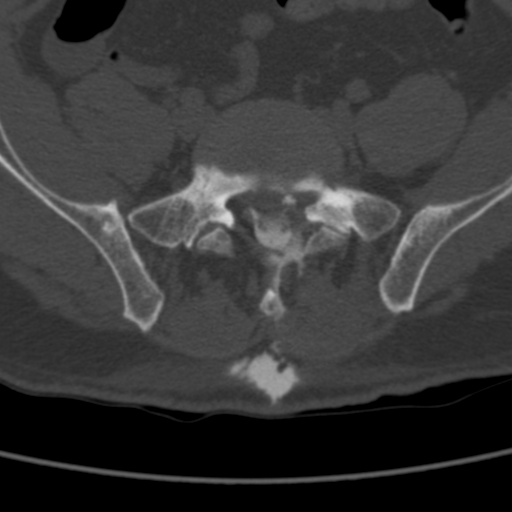
[im 37/62  bone]
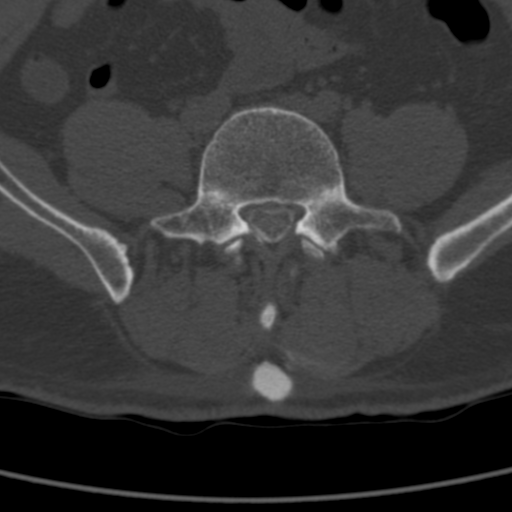
[im 49/62  bone]
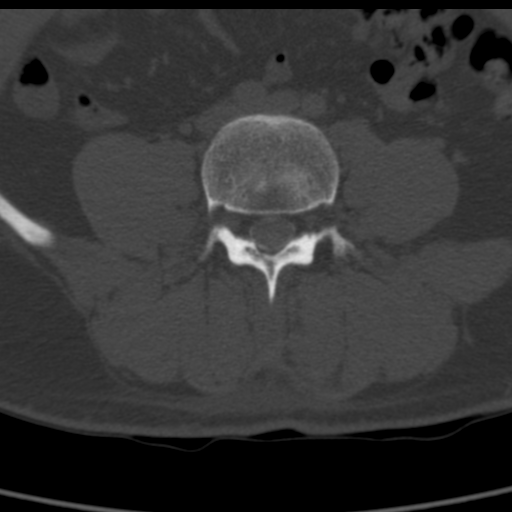

[16 of 33 positions shown; findings below may reference images not displayed]

IMPRESSION: Injection of  intrathecal contrast for myelography, with
considerable subdural contrast but no epidural contrast evident..

MYELOGRAM LUMBAR
FINDINGS: Mixed subarachnoid and subdural injection.  No evidence
of epidural contrast.  One can appreciate evidence of left
hemilaminectomy with dural bulging.  No definite leak evident at
this moment.
IMPRESSION: No definite leak at the time of myelography identified

CT MYELOGRAPHY LUMBAR SPINE
FINDINGS: L3-4 and L4-5 are unremarkable.  There is definite
leakage of contrast at the laminectomy site with contrast extending
extensively in the soft tissues.  I suspect that this is probably
due to a sharp piece of bone at the midline edge.

I should note that there is no apparent epidural contrast elsewhere
therefore I think this is a valid result.
IMPRESSION: Dural tear and leak at the left hemilaminectomy site, probably
secondary to a sharp piece of bone at the edge of the laminectomy
defect towards the midline.

## 2013-11-19 ENCOUNTER — Encounter (HOSPITAL_COMMUNITY): Payer: Self-pay | Admitting: Emergency Medicine

## 2013-11-19 ENCOUNTER — Emergency Department (HOSPITAL_COMMUNITY)
Admission: EM | Admit: 2013-11-19 | Discharge: 2013-11-19 | Disposition: A | Discharge status: Home / Self Care | Payer: Managed Care, Other (non HMO) | Attending: Emergency Medicine | Admitting: Emergency Medicine

## 2013-11-19 ENCOUNTER — Emergency Department (HOSPITAL_COMMUNITY): Payer: Managed Care, Other (non HMO)

## 2013-11-19 DIAGNOSIS — Z87891 Personal history of nicotine dependence: Secondary | ICD-10-CM | POA: Diagnosis not present

## 2013-11-19 DIAGNOSIS — F411 Generalized anxiety disorder: Secondary | ICD-10-CM | POA: Diagnosis not present

## 2013-11-19 DIAGNOSIS — Z8709 Personal history of other diseases of the respiratory system: Secondary | ICD-10-CM | POA: Insufficient documentation

## 2013-11-19 DIAGNOSIS — G8929 Other chronic pain: Secondary | ICD-10-CM | POA: Insufficient documentation

## 2013-11-19 DIAGNOSIS — M7918 Myalgia, other site: Secondary | ICD-10-CM

## 2013-11-19 DIAGNOSIS — R079 Chest pain, unspecified: Secondary | ICD-10-CM | POA: Diagnosis present

## 2013-11-19 DIAGNOSIS — IMO0001 Reserved for inherently not codable concepts without codable children: Secondary | ICD-10-CM | POA: Diagnosis not present

## 2013-11-19 DIAGNOSIS — Z79899 Other long term (current) drug therapy: Secondary | ICD-10-CM | POA: Insufficient documentation

## 2013-11-19 DIAGNOSIS — R0602 Shortness of breath: Secondary | ICD-10-CM | POA: Diagnosis not present

## 2013-11-19 HISTORY — DX: Pneumothorax, unspecified: J93.9

## 2013-11-19 LAB — CBC
HCT: 45.5 % (ref 39.0–52.0)
Hemoglobin: 15.4 g/dL (ref 13.0–17.0)
MCH: 27.4 pg (ref 26.0–34.0)
MCHC: 33.8 g/dL (ref 30.0–36.0)
MCV: 81 fL (ref 78.0–100.0)
PLATELETS: 179 10*3/uL (ref 150–400)
RBC: 5.62 MIL/uL (ref 4.22–5.81)
RDW: 13 % (ref 11.5–15.5)
WBC: 5.1 10*3/uL (ref 4.0–10.5)

## 2013-11-19 LAB — BASIC METABOLIC PANEL
ANION GAP: 10 (ref 5–15)
BUN: 20 mg/dL (ref 6–23)
CHLORIDE: 104 meq/L (ref 96–112)
CO2: 27 mEq/L (ref 19–32)
Calcium: 8.9 mg/dL (ref 8.4–10.5)
Creatinine, Ser: 1.26 mg/dL (ref 0.50–1.35)
GFR calc non Af Amer: 67 mL/min — ABNORMAL LOW (ref >90)
GFR, EST AFRICAN AMERICAN: 78 mL/min — AB (ref >90)
Glucose, Bld: 100 mg/dL — ABNORMAL HIGH (ref 70–99)
POTASSIUM: 4.2 meq/L (ref 3.7–5.3)
Sodium: 141 mEq/L (ref 137–147)

## 2013-11-19 LAB — TROPONIN I

## 2013-11-19 MED ORDER — OXYCODONE-ACETAMINOPHEN 5-325 MG PO TABS: 1.0000 | ORAL_TABLET | ORAL | Status: DC | PRN

## 2013-11-19 MED ORDER — KETOROLAC TROMETHAMINE 30 MG/ML IJ SOLN
30.0000 mg | Freq: Once | INTRAMUSCULAR | Status: AC
  Administered 2013-11-19: 30 mg via INTRAVENOUS
  Filled 2013-11-19: qty 1

## 2013-11-19 MED ORDER — LORAZEPAM 2 MG/ML IJ SOLN
1.0000 mg | Freq: Once | INTRAMUSCULAR | Status: DC
  Filled 2013-11-19: qty 1

## 2013-11-19 MED ORDER — IBUPROFEN 600 MG PO TABS: 600.0000 mg | ORAL_TABLET | Freq: Four times a day (QID) | ORAL | Status: DC | PRN

## 2013-11-19 MED ORDER — METHOCARBAMOL 750 MG PO TABS: 750.0000 mg | ORAL_TABLET | Freq: Four times a day (QID) | ORAL | Status: DC

## 2013-11-19 NOTE — ED Notes (Signed)
IV lock D/C

## 2013-11-19 NOTE — ED Notes (Signed)
Pt c/o left sided back and CP with SOB; pt sts hx of pneumothorax; pt sts started 3 days ago

## 2013-11-19 NOTE — ED Provider Notes (Signed)
CSN: [id number]     Arrival date & time 11/19/13  5284 History   First MD Initiated Contact with Patient 11/19/13 [id number]     Chief Complaint  Patient presents with  . Chest Pain  . Shortness of Breath     (Consider location/radiation/quality/duration/timing/severity/associated sxs/prior Treatment) HPI Comments: Patient here complaining of sudden onset of left-sided back pain 3 days ago. History of pneumothorax and this feels similar. Pain characterized as sharp and radiating to his chest as well as to the left side of his neck. Denies any neurological findings. No recent fever or cough. No anginal quality to this. Pain is also worse with movements his neck certain directions. No syncope or near-syncope. No rashes noted. No fever or chills. Used over-the-counter medications without relief. Does has a history of anxiety as well 2. He has never had a chest tube. Denies any leg pain or swelling. No Recent travel history.  Patient is a 43 y.o. male presenting with chest pain and shortness of breath. The history is provided by the patient.  Chest Pain Associated symptoms: shortness of breath   Shortness of Breath Associated symptoms: chest pain     Past Medical History  Diagnosis Date  . Pneumothorax   . Anxiety   . Chronic back pain    History reviewed. No pertinent past surgical history. History reviewed. No pertinent family history. History  Substance Use Topics  . Smoking status: Former Games developer  . Smokeless tobacco: Not on file  . Alcohol Use: No    Review of Systems  Respiratory: Positive for shortness of breath.   Cardiovascular: Positive for chest pain.  All other systems reviewed and are negative.     Allergies  Biaxin; Eggs or egg-derived products; and Shrimp  Home Medications   Prior to Admission medications   Medication Sig Start Date End Date Taking? Authorizing Provider  ALPRAZolam Prudy Feeler) 0.5 MG tablet Take 0.5 mg by mouth daily as needed for anxiety.   Yes  Historical Provider, MD  dexlansoprazole (DEXILANT) 60 MG capsule Take 60 mg by mouth daily.   Yes Historical Provider, MD  PARoxetine (PAXIL) 30 MG tablet Take 30 mg by mouth daily.   Yes Historical Provider, MD   BP 139/109  Pulse 84  Temp(Src) 98.1 F (36.7 C) (Oral)  Resp 12  SpO2 99% Physical Exam  Nursing note and vitals reviewed. Constitutional: He is oriented to person, place, and time. He appears well-developed and well-nourished.  Non-toxic appearance. No distress.  HENT:  Head: Normocephalic and atraumatic.  Eyes: Conjunctivae, EOM and lids are normal. Pupils are equal, round, and reactive to light.  Neck: Normal range of motion. Neck supple. No tracheal deviation present. No mass present.  Cardiovascular: Normal rate, regular rhythm and normal heart sounds.  Exam reveals no gallop.   No murmur heard. Pulmonary/Chest: Effort normal and breath sounds normal. No stridor. No respiratory distress. He has no decreased breath sounds. He has no wheezes. He has no rhonchi. He has no rales.  Abdominal: Soft. Normal appearance and bowel sounds are normal. He exhibits no distension. There is no tenderness. There is no rebound and no CVA tenderness.  Musculoskeletal: Normal range of motion. He exhibits no edema and no tenderness.       Arms: Neurological: He is alert and oriented to person, place, and time. He has normal strength. No cranial nerve deficit or sensory deficit. GCS eye subscore is 4. GCS verbal subscore is 5. GCS motor subscore is 6.  Skin: Skin  is warm and dry. No abrasion and no rash noted.  Psychiatric: He has a normal mood and affect. His speech is normal and behavior is normal.    ED Course  Procedures (including critical care time) Labs Review Labs Reviewed  CBC  BASIC METABOLIC PANEL  TROPONIN I    Imaging Review Dg Chest 2 View  11/19/2013   CLINICAL DATA:  Chest pain  EXAM: CHEST  2 VIEW  COMPARISON:  None.  FINDINGS: The heart size and mediastinal  contours are within normal limits. Both lungs are clear. The visualized skeletal structures are unremarkable.  IMPRESSION: No active cardiopulmonary disease.   Electronically Signed   By: Alcide Clever M.D.   On: 11/19/2013 10:00     EKG Interpretation   Date/Time:  Monday November 19 2013 09:01:46 EDT Ventricular Rate:  79 PR Interval:  172 QRS Duration: 76 QT Interval:  374 QTC Calculation: 428 R Axis:   72 Text Interpretation:  Normal sinus rhythm with sinus arrhythmia  Nonspecific ST abnormality Abnormal ECG Confirmed by Zeric Baranowski  MD, Pinkey Mcjunkin  ([id number]) on 11/19/2013 10:04:43 AM      MDM   Final diagnoses:  None   No evidence of pneumothorax. Suspect the patient has musculoskeletal strain. Do not think that he hasn't PE. Is due for discharge    Toy Baker, MD 11/19/13 1233

## 2013-11-19 NOTE — Discharge Instructions (Signed)
Chest Pain (Nonspecific) °It is often hard to give a specific diagnosis for the cause of chest pain. There is always a chance that your pain could be related to something serious, such as a heart attack or a blood clot in the lungs. You need to follow up with your health care provider for further evaluation. °CAUSES  °· Heartburn. °· Pneumonia or bronchitis. °· Anxiety or stress. °· Inflammation around your heart (pericarditis) or lung (pleuritis or pleurisy). °· A blood clot in the lung. °· A collapsed lung (pneumothorax). It can develop suddenly on its own (spontaneous pneumothorax) or from trauma to the chest. °· Shingles infection (herpes zoster virus). °The chest wall is composed of bones, muscles, and cartilage. Any of these can be the source of the pain. °· The bones can be bruised by injury. °· The muscles or cartilage can be strained by coughing or overwork. °· The cartilage can be affected by inflammation and become sore (costochondritis). °DIAGNOSIS  °Lab tests or other studies may be needed to find the cause of your pain. Your health care provider may have you take a test called an ambulatory electrocardiogram (ECG). An ECG records your heartbeat patterns over a 24-hour period. You may also have other tests, such as: °· Transthoracic echocardiogram (TTE). During echocardiography, sound waves are used to evaluate how blood flows through your heart. °· Transesophageal echocardiogram (TEE). °· Cardiac monitoring. This allows your health care provider to monitor your heart rate and rhythm in real time. °· Holter monitor. This is a portable device that records your heartbeat and can help diagnose heart arrhythmias. It allows your health care provider to track your heart activity for several days, if needed. °· Stress tests by exercise or by giving medicine that makes the heart beat faster. °TREATMENT  °· Treatment depends on what may be causing your chest pain. Treatment may include: °¨ Acid blockers for  heartburn. °¨ Anti-inflammatory medicine. °¨ Pain medicine for inflammatory conditions. °¨ Antibiotics if an infection is present. °· You may be advised to change lifestyle habits. This includes stopping smoking and avoiding alcohol, caffeine, and chocolate. °· You may be advised to keep your head raised (elevated) when sleeping. This reduces the chance of acid going backward from your stomach into your esophagus. °Most of the time, nonspecific chest pain will improve within 2-3 days with rest and mild pain medicine.  °HOME CARE INSTRUCTIONS  °· If antibiotics were prescribed, take them as directed. Finish them even if you start to feel better. °· For the next few days, avoid physical activities that bring on chest pain. Continue physical activities as directed. °· Do not use any tobacco products, including cigarettes, chewing tobacco, or electronic cigarettes. °· Avoid drinking alcohol. °· Only take medicine as directed by your health care provider. °· Follow your health care provider's suggestions for further testing if your chest pain does not go away. °· Keep any follow-up appointments you made. If you do not go to an appointment, you could develop lasting (chronic) problems with pain. If there is any problem keeping an appointment, call to reschedule. °SEEK MEDICAL CARE IF:  °· Your chest pain does not go away, even after treatment. °· You have a rash with blisters on your chest. °· You have a fever. °SEEK IMMEDIATE MEDICAL CARE IF:  °· You have increased chest pain or pain that spreads to your arm, neck, jaw, back, or abdomen. °· You have shortness of breath. °· You have an increasing cough, or you cough   up blood. °· You have severe back or abdominal pain. °· You feel nauseous or vomit. °· You have severe weakness. °· You faint. °· You have chills. °This is an emergency. Do not wait to see if the pain will go away. Get medical help at once. Call your local emergency services (911 in [place] Do not drive  yourself to the hospital. °MAKE SURE YOU:  °· Understand these instructions. °· Will watch your condition. °· Will get help right away if you are not doing well or get worse. °Document Released: 12/23/2004 Document Revised: 03/20/2013 Document Reviewed: 10/19/2007 °ExitCare® Patient Information ©2015 [employer]. This information is not intended to replace advice given to you by your health care provider. Make sure you discuss any questions you have with your health care provider. °Musculoskeletal Pain °Musculoskeletal pain is muscle and boney aches and pains. These pains can occur in any part of the body. Your caregiver may treat you without knowing the cause of the pain. They may treat you if blood or urine tests, X-rays, and other tests were normal.  °CAUSES °There is often not a definite cause or reason for these pains. These pains may be caused by a type of germ (virus). The discomfort may also come from overuse. Overuse includes working out too hard when your body is not fit. Boney aches also come from weather changes. Bone is sensitive to atmospheric pressure changes. °HOME CARE INSTRUCTIONS  °· Ask when your test results will be ready. Make sure you get your test results. °· Only take over-the-counter or prescription medicines for pain, discomfort, or fever as directed by your caregiver. If you were given medications for your condition, do not drive, operate machinery or power tools, or sign legal documents for 24 hours. Do not drink alcohol. Do not take sleeping pills or other medications that may interfere with treatment. °· Continue all activities unless the activities cause more pain. When the pain lessens, slowly resume normal activities. Gradually increase the intensity and duration of the activities or exercise. °· During periods of severe pain, bed rest may be helpful. Lay or sit in any position that is comfortable. °· Putting ice on the injured area. °¨ Put ice in a bag. °¨ Place a towel between  your skin and the bag. °¨ Leave the ice on for 15 to 20 minutes, 3 to 4 times a day. °· Follow up with your caregiver for continued problems and no reason can be found for the pain. If the pain becomes worse or does not go away, it may be necessary to repeat tests or do additional testing. Your caregiver may need to look further for a possible cause. °SEEK IMMEDIATE MEDICAL CARE IF: °· You have pain that is getting worse and is not relieved by medications. °· You develop chest pain that is associated with shortness or breath, sweating, feeling sick to your stomach (nauseous), or throw up (vomit). °· Your pain becomes localized to the abdomen. °· You develop any new symptoms that seem different or that concern you. °MAKE SURE YOU:  °· Understand these instructions. °· Will watch your condition. °· Will get help right away if you are not doing well or get worse. °Document Released: 03/15/2005 Document Revised: 06/07/2011 Document Reviewed: 11/17/2012 °ExitCare® Patient Information ©2015 [employer]. This information is not intended to replace advice given to you by your health care provider. Make sure you discuss any questions you have with your health care provider. ° °

## 2014-05-13 DIAGNOSIS — M4726 Other spondylosis with radiculopathy, lumbar region: Secondary | ICD-10-CM | POA: Insufficient documentation

## 2014-05-13 DIAGNOSIS — M5416 Radiculopathy, lumbar region: Secondary | ICD-10-CM | POA: Insufficient documentation

## 2014-05-13 DIAGNOSIS — M5136 Other intervertebral disc degeneration, lumbar region: Secondary | ICD-10-CM | POA: Insufficient documentation

## 2015-05-16 ENCOUNTER — Encounter (HOSPITAL_COMMUNITY): Payer: Self-pay | Admitting: Emergency Medicine

## 2015-05-16 ENCOUNTER — Emergency Department (HOSPITAL_COMMUNITY)
Admission: EM | Admit: 2015-05-16 | Discharge: 2015-05-17 | Disposition: A | Discharge status: Other Acute Inpatient Hospital | Payer: Medicaid Other | Attending: Emergency Medicine | Admitting: Emergency Medicine

## 2015-05-16 DIAGNOSIS — F411 Generalized anxiety disorder: Secondary | ICD-10-CM | POA: Diagnosis not present

## 2015-05-16 DIAGNOSIS — R002 Palpitations: Secondary | ICD-10-CM | POA: Insufficient documentation

## 2015-05-16 DIAGNOSIS — R51 Headache: Secondary | ICD-10-CM | POA: Diagnosis not present

## 2015-05-16 DIAGNOSIS — Z8709 Personal history of other diseases of the respiratory system: Secondary | ICD-10-CM | POA: Insufficient documentation

## 2015-05-16 DIAGNOSIS — F410 Panic disorder [episodic paroxysmal anxiety] without agoraphobia: Secondary | ICD-10-CM | POA: Diagnosis not present

## 2015-05-16 DIAGNOSIS — F329 Major depressive disorder, single episode, unspecified: Secondary | ICD-10-CM | POA: Diagnosis not present

## 2015-05-16 DIAGNOSIS — G4700 Insomnia, unspecified: Secondary | ICD-10-CM | POA: Insufficient documentation

## 2015-05-16 DIAGNOSIS — R0602 Shortness of breath: Secondary | ICD-10-CM | POA: Insufficient documentation

## 2015-05-16 DIAGNOSIS — F1310 Sedative, hypnotic or anxiolytic abuse, uncomplicated: Secondary | ICD-10-CM | POA: Insufficient documentation

## 2015-05-16 DIAGNOSIS — G8929 Other chronic pain: Secondary | ICD-10-CM | POA: Insufficient documentation

## 2015-05-16 DIAGNOSIS — F419 Anxiety disorder, unspecified: Secondary | ICD-10-CM | POA: Insufficient documentation

## 2015-05-16 DIAGNOSIS — Z79899 Other long term (current) drug therapy: Secondary | ICD-10-CM | POA: Insufficient documentation

## 2015-05-16 HISTORY — DX: Panic disorder (episodic paroxysmal anxiety): F41.0

## 2015-05-16 LAB — CBC WITH DIFFERENTIAL/PLATELET
BASOS PCT: 0 %
Basophils Absolute: 0 10*3/uL (ref 0.0–0.1)
Eosinophils Absolute: 0.1 10*3/uL (ref 0.0–0.7)
Eosinophils Relative: 1 %
HEMATOCRIT: 46 % (ref 39.0–52.0)
HEMOGLOBIN: 16 g/dL (ref 13.0–17.0)
Lymphocytes Relative: 31 %
Lymphs Abs: 2.3 10*3/uL (ref 0.7–4.0)
MCH: 27.8 pg (ref 26.0–34.0)
MCHC: 34.8 g/dL (ref 30.0–36.0)
MCV: 79.9 fL (ref 78.0–100.0)
MONOS PCT: 6 %
Monocytes Absolute: 0.5 10*3/uL (ref 0.1–1.0)
NEUTROS ABS: 4.6 10*3/uL (ref 1.7–7.7)
NEUTROS PCT: 62 %
Platelets: 170 10*3/uL (ref 150–400)
RBC: 5.76 MIL/uL (ref 4.22–5.81)
RDW: 12.8 % (ref 11.5–15.5)
WBC: 7.4 10*3/uL (ref 4.0–10.5)

## 2015-05-16 LAB — COMPREHENSIVE METABOLIC PANEL
ALK PHOS: 63 U/L (ref 38–126)
ALT: 53 U/L (ref 17–63)
AST: 45 U/L — AB (ref 15–41)
Albumin: 3.9 g/dL (ref 3.5–5.0)
Anion gap: 12 (ref 5–15)
BILIRUBIN TOTAL: 1.2 mg/dL (ref 0.3–1.2)
BUN: 15 mg/dL (ref 6–20)
CO2: 21 mmol/L — ABNORMAL LOW (ref 22–32)
Calcium: 9.7 mg/dL (ref 8.9–10.3)
Chloride: 105 mmol/L (ref 101–111)
Creatinine, Ser: 1.22 mg/dL (ref 0.61–1.24)
GFR calc Af Amer: >60 mL/min (ref >60)
GLUCOSE: 124 mg/dL — AB (ref 65–99)
Potassium: 3.6 mmol/L (ref 3.5–5.1)
Sodium: 138 mmol/L (ref 135–145)
Total Protein: 6.8 g/dL (ref 6.5–8.1)

## 2015-05-16 LAB — RAPID URINE DRUG SCREEN, HOSP PERFORMED
AMPHETAMINES: NOT DETECTED
BARBITURATES: NOT DETECTED
Benzodiazepines: POSITIVE — AB
Cocaine: NOT DETECTED
OPIATES: NOT DETECTED
TETRAHYDROCANNABINOL: NOT DETECTED

## 2015-05-16 LAB — ETHANOL: Alcohol, Ethyl (B): <5 mg/dL (ref <5)

## 2015-05-16 MED ORDER — ONDANSETRON HCL 4 MG PO TABS: 4.0000 mg | ORAL_TABLET | Freq: Three times a day (TID) | ORAL | Status: DC | PRN

## 2015-05-16 MED ORDER — ALPRAZOLAM 0.25 MG PO TABS
0.5000 mg | ORAL_TABLET | Freq: Once | ORAL | Status: AC
  Administered 2015-05-16: 0.5 mg via ORAL
  Filled 2015-05-16: qty 2

## 2015-05-16 MED ORDER — ACETAMINOPHEN 325 MG PO TABS: 650.0000 mg | ORAL_TABLET | ORAL | Status: DC | PRN

## 2015-05-16 MED ORDER — PAROXETINE HCL 20 MG PO TABS: 20.0000 mg | ORAL_TABLET | Freq: Every day | ORAL | Status: DC

## 2015-05-16 MED ORDER — LORAZEPAM 1 MG PO TABS: 1.0000 mg | ORAL_TABLET | Freq: Three times a day (TID) | ORAL | Status: DC | PRN

## 2015-05-16 NOTE — ED Notes (Signed)
Called PT to take to fast track room 6, no answer. Place PT record back in lobby

## 2015-05-16 NOTE — ED Notes (Addendum)
Pt. reports worsening anxiety/panic attacks for several weeks unrelieved by prescription Paxil and Xanax. Denies suicidal ideation or hallucinations .

## 2015-05-16 NOTE — ED Notes (Signed)
MD at bedside. 

## 2015-05-16 NOTE — ED Provider Notes (Signed)
CSN: [id number]     Arrival date & time 05/16/15  2006 History  By signing my name below, I, Aaron Meyer, attest that this documentation has been prepared under the direction and in the presence of Derwood Kaplan, MD. Electronically Signed: Angelene Meyer, ED Scribe. 05/16/2015. 12:30 AM.    Chief Complaint  Patient presents with  . Anxiety  . Panic Attack  . Agitation   The history is provided by the patient. No language interpreter was used.  HPI Comments: Aaron Meyer is a 45 y.o. male with a hx of anxiety and panic disorder who presents to the Emergency Department complaining of gradually worsening multiple episodes of panic attacks onset 3-4 weeks ago. He explains his symptoms as a "breakdown" with SOB and palpitations. He reports associated constant mild generalized HA and insomnia. He states that he was on Paxil and Xanax as needed but recently switched insurance so he was not able to refill his Paxil for approx. 6 months. Pt started his Paxil and Xanax again yesterday. He also states that there has been added stress with trying to work out his insurance. He denies that he has been admitted in the psychiatric ward in the past. Pt was seen this am at [facility] in [place] for evaluation and told that pt would not be able to see a psychiatrist for several weeks so they went to another facility in [place] but was still unable to see a psychiatrist. He denies any alcohol or drug abuse. Pt denies any SI, HI, or visual/auditory hallucinations.     Past Medical History  Diagnosis Date  . Pneumothorax   . Anxiety   . Chronic back pain   . Panic disorder    History reviewed. No pertinent past surgical history. No family history on file. Social History  Substance Use Topics  . Smoking status: Former Games developer  . Smokeless tobacco: None  . Alcohol Use: No    Review of Systems  Respiratory: Positive for shortness of breath.   Cardiovascular: Positive for palpitations.   Neurological: Positive for headaches (mild).  Psychiatric/Behavioral: Positive for sleep disturbance. Negative for suicidal ideas and hallucinations.    Allergies  Biaxin; Eggs or egg-derived products; and Shrimp  Home Medications   Prior to Admission medications   Medication Sig Start Date End Date Taking? Authorizing Provider  ALPRAZolam Prudy Feeler) 0.5 MG tablet Take 0.5-1 mg by mouth daily as needed for anxiety.    Yes Historical Provider, MD  calcium carbonate (TUMS EX) 750 MG chewable tablet Chew 2-3 tablets by mouth every 4 (four) hours as needed for heartburn.   Yes Historical Provider, MD  PARoxetine (PAXIL) 20 MG tablet Take 20 mg by mouth daily.   Yes Historical Provider, MD   BP 124/88 mmHg  Pulse 72  Temp(Src) 98 F (36.7 C) (Oral)  Resp 16  SpO2 93% Physical Exam  Constitutional: He is oriented to person, place, and time. He appears well-developed and well-nourished.  HENT:  Head: Normocephalic and atraumatic.  Cardiovascular: Normal rate.   Pulmonary/Chest: Effort normal.  Abdominal: He exhibits no distension.  Neurological: He is alert and oriented to person, place, and time.  Skin: Skin is warm and dry.  Psychiatric: He has a normal mood and affect. Judgment and thought content normal.  Nursing note and vitals reviewed.   ED Course  Procedures (including critical care time) DIAGNOSTIC STUDIES: Oxygen Saturation is 95% on RA, normal by my interpretation.    COORDINATION OF CARE: 11:22 PM- Pt advised of  plan for treatment and pt agrees. Explained that adding a sleeping medication to his current medications will not be beneficial. Pt will receive 20 mg Paxil and Xanax. Pt will be admitted to a holding ward to skype with [facility] and assured that a psychiatrist will at least review his case.    Labs Review Labs Reviewed  COMPREHENSIVE METABOLIC PANEL - Abnormal; Notable for the following:    CO2 21 (*)    Glucose, Bld 124 (*)    AST 45 (*)    All other components  within normal limits  URINE RAPID DRUG SCREEN, HOSP PERFORMED - Abnormal; Notable for the following:    Benzodiazepines POSITIVE (*)    All other components within normal limits  ETHANOL  CBC WITH DIFFERENTIAL/PLATELET    Imaging Review No results found.   Derwood Kaplan, MD has personally reviewed and evaluated these images and lab results as part of his medical decision-making.  MDM   Final diagnoses:  Anxiety attack    I personally performed the services described in this documentation, which was scribed in my presence. The recorded information has been reviewed and is accurate.  Pt comes in with cc of severe anxiety. On paxil and xanax, but doesn't feel that the symptoms are in proper control and feels that he is on the brink of a breakdown.  Psycho consulted. Pt has no SI, HI. This is his 3rd destination today to get some help - psych wants to admit as obs.     Derwood Kaplan, MD 05/17/15 0127

## 2015-05-17 ENCOUNTER — Other Ambulatory Visit: Payer: Self-pay

## 2015-05-17 ENCOUNTER — Observation Stay (HOSPITAL_COMMUNITY)
Admission: AD | Admit: 2015-05-17 | Discharge: 2015-05-18 | Disposition: A | Discharge status: Home / Self Care | Payer: Medicaid Other | Source: Intra-hospital | Attending: Psychiatry | Admitting: Psychiatry

## 2015-05-17 ENCOUNTER — Encounter (HOSPITAL_COMMUNITY): Payer: Self-pay | Admitting: *Deleted

## 2015-05-17 DIAGNOSIS — F410 Panic disorder [episodic paroxysmal anxiety] without agoraphobia: Principal | ICD-10-CM | POA: Insufficient documentation

## 2015-05-17 DIAGNOSIS — F32A Depression, unspecified: Secondary | ICD-10-CM | POA: Insufficient documentation

## 2015-05-17 DIAGNOSIS — F329 Major depressive disorder, single episode, unspecified: Secondary | ICD-10-CM | POA: Diagnosis not present

## 2015-05-17 DIAGNOSIS — R51 Headache: Secondary | ICD-10-CM | POA: Diagnosis not present

## 2015-05-17 DIAGNOSIS — F411 Generalized anxiety disorder: Secondary | ICD-10-CM | POA: Diagnosis not present

## 2015-05-17 MED ORDER — HYDROXYZINE HCL 25 MG PO TABS
25.0000 mg | ORAL_TABLET | Freq: Three times a day (TID) | ORAL | Status: DC | PRN
  Administered 2015-05-17 – 2015-05-18 (×2): 25 mg via ORAL
  Filled 2015-05-17 (×2): qty 1

## 2015-05-17 MED ORDER — CHLORDIAZEPOXIDE HCL 25 MG PO CAPS: 25.0000 mg | ORAL_CAPSULE | ORAL | Status: DC

## 2015-05-17 MED ORDER — ALUM & MAG HYDROXIDE-SIMETH 200-200-20 MG/5ML PO SUSP
30.0000 mL | ORAL | Status: DC | PRN
  Administered 2015-05-17: 30 mL via ORAL
  Filled 2015-05-17: qty 30

## 2015-05-17 MED ORDER — PAROXETINE HCL 10 MG PO TABS
20.0000 mg | ORAL_TABLET | Freq: Every day | ORAL | Status: DC
  Administered 2015-05-17 – 2015-05-18 (×2): 20 mg via ORAL
  Filled 2015-05-17 (×2): qty 2

## 2015-05-17 MED ORDER — ADULT MULTIVITAMIN W/MINERALS CH
1.0000 | ORAL_TABLET | Freq: Every day | ORAL | Status: DC
  Administered 2015-05-18: 1 via ORAL
  Filled 2015-05-17: qty 1

## 2015-05-17 MED ORDER — TRAZODONE HCL 50 MG PO TABS
50.0000 mg | ORAL_TABLET | Freq: Every evening | ORAL | Status: DC | PRN
  Administered 2015-05-18: 50 mg via ORAL
  Filled 2015-05-17: qty 1

## 2015-05-17 MED ORDER — CHLORDIAZEPOXIDE HCL 25 MG PO CAPS: 25.0000 mg | ORAL_CAPSULE | Freq: Every day | ORAL | Status: DC

## 2015-05-17 MED ORDER — CHLORDIAZEPOXIDE HCL 25 MG PO CAPS: 25.0000 mg | ORAL_CAPSULE | Freq: Four times a day (QID) | ORAL | Status: DC | PRN

## 2015-05-17 MED ORDER — BUSPIRONE HCL 5 MG PO TABS
5.0000 mg | ORAL_TABLET | Freq: Three times a day (TID) | ORAL | Status: DC
  Administered 2015-05-17 – 2015-05-18 (×3): 5 mg via ORAL
  Filled 2015-05-17 (×3): qty 1

## 2015-05-17 MED ORDER — CHLORDIAZEPOXIDE HCL 25 MG PO CAPS
25.0000 mg | ORAL_CAPSULE | Freq: Four times a day (QID) | ORAL | Status: AC
  Administered 2015-05-17 (×3): 25 mg via ORAL
  Filled 2015-05-17 (×3): qty 1

## 2015-05-17 MED ORDER — ONDANSETRON 4 MG PO TBDP: 4.0000 mg | ORAL_TABLET | Freq: Four times a day (QID) | ORAL | Status: DC | PRN

## 2015-05-17 MED ORDER — ALPRAZOLAM 0.25 MG PO TABS: 0.5000 mg | ORAL_TABLET | Freq: Every day | ORAL | Status: DC | PRN

## 2015-05-17 MED ORDER — CHLORDIAZEPOXIDE HCL 25 MG PO CAPS
25.0000 mg | ORAL_CAPSULE | Freq: Three times a day (TID) | ORAL | Status: DC
  Administered 2015-05-18: 25 mg via ORAL
  Filled 2015-05-17: qty 1

## 2015-05-17 MED ORDER — MAGNESIUM HYDROXIDE 400 MG/5ML PO SUSP: 30.0000 mL | Freq: Every day | ORAL | Status: DC | PRN

## 2015-05-17 MED ORDER — HYDROXYZINE HCL 25 MG PO TABS: 25.0000 mg | ORAL_TABLET | ORAL | Status: DC | PRN

## 2015-05-17 MED ORDER — ACETAMINOPHEN 325 MG PO TABS
650.0000 mg | ORAL_TABLET | Freq: Four times a day (QID) | ORAL | Status: DC | PRN
  Administered 2015-05-17: 650 mg via ORAL
  Filled 2015-05-17: qty 2

## 2015-05-17 MED ORDER — LOPERAMIDE HCL 2 MG PO CAPS: 2.0000 mg | ORAL_CAPSULE | ORAL | Status: DC | PRN

## 2015-05-17 NOTE — Progress Notes (Signed)
Admission Note:  Patient is a 45 year old male who present voluntarily to [facility] with a complaint of anxiety and panic attack. On admission, patient appear anxious, cooperative with the admission process. Patient stated "I have anxiety and panic attacks. I experience panic attacks 3-4 times/day.. Patient also stated that he wants his medications striaghtened out. I use Xanax and Plaxil daily but I ran out of it because I lost my insurance. Patient reports chronic mild headache and chronic back pain. 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  A: Skin was assessed, no abnormalities/contraband noted. POC and unit policies explained and understanding verbalized. Consents obtained. Food and fluids offered..  R: Patient had no additional questions or concerns.

## 2015-05-17 NOTE — BH Assessment (Addendum)
Tele Assessment Note   [name] is a Caucasian, married 45 y.o. male voluntarily presenting to [facility] accompanied by his wife c/o worsening anxiety and panic attacks for the past 3-4 weeks. Pt reports a hx of anxiety since his 20's. He states that he had been on Paxil and Xanax for 5 years when he lost his insurance and had to go off of these medications for 6 months. Pt just started back on his Paxil 20 mg and Xanax 0.5 mg (PRN) yesterday; he is concerned that these may not be the best medications to control his symptoms and wants to speak with a psychiatrist about his medication regimen. [facility] [place] [facility] today and was told it would be weeks to get an appt. He then went to [facility] walk-in clinic and was told that he could not see a psychiatrist immediately there either. Pt reports feeling like he is constantly in "fight or flight mode". He c/o excessive worrying and anxiety in a variety of settings, as well as restlessness, feeling on edge, fatigued, racing thoughts, irritability, muscle tension, and difficulty falling asleep. He reports having panic attacks up to 3x per day which include heart palpitations, shortness of breath, trembling and shaking, and fear of the possible consequences of these attacks (i.e. heart attack, lung collapse). Current stressors include being unable to work due to injury in 2013, financial problems, and stress related to seeking SSDI benefits. As a result of these stressors, pt also c/o fatigue, feeling like a burden on his family, decreased appetite, insomnia, feelings of guilt, tearfulness, and isolation. He has also been suffering with daily tension headaches. Pt is not currently under the care of a psychiatrist or therapist but reports seeing various therapists in past years. He has no prior psychiatric hospitalizations. He denies hx of suicide attempt. He denies SI/HI, A/VH, SA, or self-harming behaviors. No hx of violent  behavior. No hx of abuse or trauma reported. Family hx is positive for anxiety and mood disorder.  - Per Hulan Fess, NP, Pt meets criteria for [facility] Observation Unit. Per Binnie Rail, AC, Pt accepted to Obs bed #1 under the care of Dr Lucianne Muss.  Diagnosis: 300.02 Generalized Anxiety Disorder, 300.01 Panic Disorder  Past Medical History:  Past Medical History  Diagnosis Date  . Pneumothorax   . Anxiety   . Chronic back pain   . Panic disorder     History reviewed. No pertinent past surgical history.  Family History: No family history on file.  Social History:  reports that he has quit smoking. He does not have any smokeless tobacco history on file. He reports that he does not drink alcohol or use illicit drugs.  Additional Social History:  Alcohol / Drug Use Pain Medications: See PTA med list Prescriptions: See PTA med list Over the Counter: See PTA med list History of alcohol / drug use?: No history of alcohol / drug abuse  CIWA: CIWA-Ar BP: 123/83 mmHg Pulse Rate: 60 COWS:    PATIENT STRENGTHS: (choose at least two) Ability for insight Average or above average intelligence Capable of independent living Communication skills Motivation for treatment/growth Supportive family/friends  Allergies:  Allergies  Allergen Reactions  . Biaxin [Clarithromycin] Nausea And Vomiting  . Eggs Or Egg-Derived Products Hives  . Shrimp [Shellfish Allergy] Hives    Home Medications:  (Not in a hospital admission)  OB/GYN Status:  No LMP for male patient.  General Assessment Data Location of Assessment: [facility] ED  TTS Assessment: In system Is this a Tele or Face-to-Face Assessment?: Tele Assessment Is this an Initial Assessment or a Re-assessment for this encounter?: Initial Assessment Marital status: Married [place] name: n/a Is patient pregnant?: No Pregnancy Status: No Living Arrangements: Spouse/significant other, Children Can pt return to current living arrangement?:  Yes Admission Status: Voluntary Is patient capable of signing voluntary admission?: Yes Referral Source: Self/Family/Friend Insurance type: Medicaid  Medical Screening Exam [facility] Walk-in ONLY) Medical Exam completed: Yes  Crisis Care Plan Living Arrangements: Spouse/significant other, Children Legal Guardian:  (none) Name of Psychiatrist: None Name of Therapist: None  Education Status Is patient currently in school?: No Current Grade: na Highest grade of school patient has completed: na Name of school: na Contact person: na  Risk to self with the past 6 months Suicidal Ideation: No Has patient been a risk to self within the past 6 months prior to admission? : No Suicidal Intent: No Has patient had any suicidal intent within the past 6 months prior to admission? : No Is patient at risk for suicide?: No Suicidal Plan?: No Has patient had any suicidal plan within the past 6 months prior to admission? 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 : No Thoughts of Harm to Others: No Current Homicidal Intent: No Current Homicidal Plan:  No Access to Homicidal Means: No Identified Victim: n/a History of harm to others?: No Assessment of Violence: None Noted Violent Behavior Description: No known hx of violence Does patient have access to weapons?: No Criminal Charges Pending?: No Does patient have a court date: No Is patient on probation?: No  Psychosis Hallucinations: None noted Delusions: None noted  Mental Status Report Appearance/Hygiene: In scrubs Eye Contact: Good Motor Activity: Freedom of movement Speech: Logical/coherent Level of Consciousness: Quiet/awake Mood: Depressed Affect: Anxious Anxiety Level: Panic Attacks Panic attack frequency: 3x per day Most recent panic attack: Today Thought Processes: Coherent, Relevant Judgement: Unimpaired Orientation: Person, Place, Time, Situation Obsessive Compulsive Thoughts/Behaviors: None  Cognitive Functioning Concentration: Good Memory: Recent Intact IQ: Average Insight: Good Impulse Control: Good Appetite: Fair Weight Loss: 0 Weight Gain: 0 Sleep: Decreased Total Hours of Sleep: 4 Vegetative Symptoms: None  ADLScreening [place] [facility]) Patient's cognitive ability adequate to safely complete daily activities?: Yes Patient able to express need for assistance with ADLs?: Yes Independently performs ADLs?: Yes (appropriate for developmental age)  Prior Inpatient Therapy Prior Inpatient Therapy: No Prior Therapy Dates: na Prior Therapy Facilty/Provider(s): na Reason for Treatment: na  Prior Outpatient Therapy Prior Outpatient Therapy: Yes Prior Therapy Dates: Multiple in past Prior Therapy Facilty/Provider(s): Pt cannot recall names/agencies Reason for Treatment: Anxiety Does patient have an ACCT team?: No Does patient have Intensive In-House Services?  : No Does patient have [facility] services? : No Does patient have P4CC services?: No  ADL Screening (condition at time of admission) Patient's cognitive ability adequate to safely  complete daily activities?: Yes Is the patient deaf or have difficulty hearing?: No Does the patient have difficulty seeing, even when wearing glasses/contacts?: No Does the patient have difficulty concentrating, remembering, or making decisions?: No Patient able to express need for assistance with ADLs?: Yes Does the patient have difficulty dressing or bathing?: No Independently performs ADLs?: Yes (appropriate for developmental age) Does the patient have difficulty walking or climbing stairs?: No Weakness of Legs: None Weakness of Arms/Hands: None  Home Assistive Devices/Equipment Home Assistive Devices/Equipment: None    Abuse/Neglect Assessment (Assessment to be complete while patient is alone) Physical Abuse: Denies Verbal Abuse: Denies Sexual Abuse: Denies Exploitation of patient/patient's resources: Denies Self-Neglect: Denies Values / Beliefs Cultural Requests During Hospitalization: None Spiritual Requests During Hospitalization: None   Advance Directives (For Healthcare) Does patient have an advance directive?: No Would patient like information on creating an advanced directive?: No - patient declined information    Additional Information 1:1 In Past 12 Months?: No CIRT Risk: No Elopement Risk: No Does patient have medical clearance?: Yes     Disposition: Per Hulan Fess, NP, Pt meets criteria for [facility] Observation Unit. Per Binnie Rail, AC, Pt accepted to Obs bed #1 under the care of Dr Lucianne Muss. Disposition Initial Assessment Completed for this Encounter: Yes Disposition of Patient: Inpatient treatment program Type of inpatient treatment program: Adult (Per Hulan Fess, NP, Pt meets criteria for obs unit.)  Cyndie Mull, [facility]  05/17/2015 6:55 AM

## 2015-05-17 NOTE — BHH Counselor (Signed)
Pt was concerned about aftercare and medication management followed by long wait times for psych appt. This Clinical research associate contacted [facility] for available options. The following aftercare options are available:  [facility],  : Walk in basis  [place], [place]: Walk In basis [facility]: Walk in basis [phone] and appt priority via fax / referral from [facility] possible  Appointment based: [facility] [place], [place] [phone]  [facility] [place] [phone] of [place], [place] [phone]  Pt was agreeable to these options, and also stated he would wait until a.m. To speak with NP/ extender for [facility]su on continued medication/ further care options. Thadeus Gandolfi K. Sherlon Handing, LPC-A, [facility]  Counselor 05/17/2015 6:38 PM

## 2015-05-17 NOTE — Progress Notes (Signed)
[facility] INPATIENT:  Family/Significant Other Suicide Prevention Education  Suicide Prevention Education:  Patient Refusal for Family/Significant Other Suicide Prevention Education: The patient Aaron Meyer has refused to provide written consent for family/significant other to be provided Family/Significant Other Suicide Prevention Education during admission and/or prior to discharge.  Physician notified.  Glenice Laine B 05/17/2015, 5:05 AM

## 2015-05-17 NOTE — [facility] Counselor (Signed)
Psych disposition: Per Hulan Fess, NP, Pt meets criteria for [facility]. Per Binnie Rail, AC, Pt accepted to Obs bed #1 under the care of Dr Lucianne Muss. Number to call for report is [phone].  Dr Rhunette Croft and Victorino Dike, RN at [facility] both informed of disposition. Pt was informed of recommendations personally by Dr Rhunette Croft and is in agreement.   Cyndie Mull, [facility]   Therapeutic Triage   [facility]   Ext [phone]

## 2015-05-17 NOTE — Progress Notes (Signed)
D: Patient restless and walking around unit. Pt requesting multiple medications and has multiple somatic complaints. Pt with med-seeking behaviors. No s/s of distress noted EKG completed as ordered.  A: Continuous observation, administer medications as ordered by provider. R: Pt cooperative with care. No inappropriate behaviors noted.

## 2015-05-17 NOTE — Progress Notes (Signed)
Within less than 30 minutes of receiving his evening medications, pt again requesting additional medication stating that what he is on is not effective. Pt continues to have med-seeking behaviors. Pt on the phone with his family and standing within a couple of feet of this RN talking about how "My meds are not working. I need something else". RN encouraged pt to give the meds time to work and to take effect. Pt continues to pace the unit.

## 2015-05-17 NOTE — H&P (Signed)
Observation Psychiatric Admission Assessment Adult  Patient Identification: Aaron Meyer MRN:  [id number] Date of Evaluation:  05/17/2015 Chief Complaint:  general anxiety diosrder panic disorder Principal Diagnosis: GAD (generalized anxiety disorder) Diagnosis:   Patient Active Problem List   Diagnosis Date Noted  . GAD (generalized anxiety disorder) [F41.1] 05/17/2015  . Panic attack [F41.0] 05/17/2015   History of Present Illness:: Aaron Meyer is a Caucasian, married 45 yr old white male with complaints of worsening anxiety and panic attacks for the past 3-4 weeks with headache being one of the stressors.  States that he recently started having headaches about a month ago and since the start of the headache his anxiety has gotten worse.  Patient states that he has seen his primary physician who restarted Paxil and Xanax.  States that he was then referred to [facility] who sent him to [facility].  Patient states that anxiety has gotten so bad that his panic disorder has also worsened.  "The only way I can explain to you how I feel is if you are afraid of spiders and there is a glass box of spiders sitting next to you and all of a sudden the glass box breaks and all of the spiders are out surrounding you.  That panic you feel when something like that happens is how I feel all the time.  I feel like my heart is beating out of my chest, I have chest pain, and hard to breath."   Patient states that he had Xanax left over from 6 months a go 15-16 tablets that he started taking 0.5 mg and would take 2-3 daily.  Saw primary MD yesterday and was given a RX for Paxil and Xanax.   Associated Signs/Symptoms: Depression Symptoms:  depressed mood, insomnia, hopelessness, anxiety, panic attacks, (Hypo) Manic Symptoms:  Distractibility, Irritable Mood, Anxiety Symptoms:  Excessive Worry, Psychotic Symptoms:  Denies PTSD Symptoms: Denies Total Time spent with patient: 45 minutes  Past Psychiatric  History: General Anxiety Disorder  Is the patient at risk to self? No.  Has the patient been a risk to self in the past 6 months? No.  Has the patient been a risk to self within the distant past? No.  Is the patient a risk to others? No.  Has the patient been a risk to others in the past 6 months? No.  Has the patient been a risk to others within the distant past? No.   Prior Inpatient Therapy:   None Prior Outpatient Therapy:  Yes, None currently was referred to [facility] by primary MD  Alcohol Screening:   Substance Abuse History in the last 12 months:  No. Consequences of Substance Abuse: Denies Previous Psychotropic Medications: Yes Paxil current.  Past Seroquel, Effexor, Lexapro, Prozac, Zoloft, Wellbutrin.  States that he was moving from one doctor to the next and sometime wasn't on medications for that long.  States that he has been on the Paxil for 5-6 years and when he is taking the medication works.   Psychological Evaluations: Never smoke Past Medical History:  Past Medical History  Diagnosis Date  . Pneumothorax   . Anxiety   . Chronic back pain   . Panic disorder    History reviewed. No pertinent past surgical history. Family History: History reviewed. No pertinent family history. Family Psychiatric  History: Denies Tobacco Screening: @FLOW [phone])::1)@ Social History:  History  Alcohol Use No     History  Drug Use No    Additional Social History:  Allergies:  Allergies  Allergen Reactions  . Biaxin [Clarithromycin] Nausea And Vomiting  . Eggs Or Egg-Derived Products Hives  . Shrimp [Shellfish Allergy] Hives   Lab Results:  Results for orders placed or performed during the hospital encounter of 05/16/15 (from the past 48 hour(s))  Comprehensive metabolic panel     Status: Abnormal   Collection Time: 05/16/15  8:33 PM  Result Value Ref Range   Sodium 138 135 - 145 mmol/L   Potassium 3.6 3.5 - 5.1 mmol/L   Chloride 105 101 - 111 mmol/L   CO2 21 (L)  22 - 32 mmol/L   Glucose, Bld 124 (H) 65 - 99 mg/dL   BUN 15 6 - 20 mg/dL   Creatinine, Ser 1.22 0.61 - 1.24 mg/dL   Calcium 9.7 8.9 - 10.3 mg/dL   Total Protein 6.8 6.5 - 8.1 g/dL   Albumin 3.9 3.5 - 5.0 g/dL   AST 45 (H) 15 - 41 U/L   ALT 53 17 - 63 U/L   Alkaline Phosphatase 63 38 - 126 U/L   Total Bilirubin 1.2 0.3 - 1.2 mg/dL   GFR calc non Af Amer >60 >60 mL/min   GFR calc Af Amer >60 >60 mL/min    Comment: (NOTE) The eGFR has been calculated using the CKD EPI equation. This calculation has not been validated in all clinical situations. eGFR's persistently <60 mL/min signify possible Chronic Kidney Disease.    Anion gap 12 5 - 15  Ethanol     Status: None   Collection Time: 05/16/15  8:33 PM  Result Value Ref Range   Alcohol, Ethyl (B) <5 <5 mg/dL    Comment:        LOWEST DETECTABLE LIMIT FOR SERUM ALCOHOL IS 5 mg/dL FOR MEDICAL PURPOSES ONLY   CBC with Diff     Status: None   Collection Time: 05/16/15  8:33 PM  Result Value Ref Range   WBC 7.4 4.0 - 10.5 Aaron/uL   RBC 5.76 4.22 - 5.81 MIL/uL   Hemoglobin 16.0 13.0 - 17.0 g/dL   HCT 46.0 39.0 - 52.0 %   MCV 79.9 78.0 - 100.0 fL   MCH 27.8 26.0 - 34.0 pg   MCHC 34.8 30.0 - 36.0 g/dL   RDW 12.8 11.5 - 15.5 %   Platelets 170 150 - 400 Aaron/uL   Neutrophils Relative % 62 %   Neutro Abs 4.6 1.7 - 7.7 Aaron/uL   Lymphocytes Relative 31 %   Lymphs Abs 2.3 0.7 - 4.0 Aaron/uL   Monocytes Relative 6 %   Monocytes Absolute 0.5 0.1 - 1.0 Aaron/uL   Eosinophils Relative 1 %   Eosinophils Absolute 0.1 0.0 - 0.7 Aaron/uL   Basophils Relative 0 %   Basophils Absolute 0.0 0.0 - 0.1 Aaron/uL  Urine rapid drug screen (hosp performed)not at [facility]     Status: Abnormal   Collection Time: 05/16/15  9:15 PM  Result Value Ref Range   Opiates NONE DETECTED NONE DETECTED   Cocaine NONE DETECTED NONE DETECTED   Benzodiazepines POSITIVE (A) NONE DETECTED   Amphetamines NONE DETECTED NONE DETECTED   Tetrahydrocannabinol NONE DETECTED NONE DETECTED    Barbiturates NONE DETECTED NONE DETECTED    Comment:        DRUG SCREEN FOR MEDICAL PURPOSES ONLY.  IF CONFIRMATION IS NEEDED FOR ANY PURPOSE, NOTIFY LAB WITHIN 5 DAYS.        LOWEST DETECTABLE LIMITS FOR URINE DRUG SCREEN Drug Class  Cutoff (ng/mL) Amphetamine      1000 Barbiturate      200 Benzodiazepine   644 Tricyclics       034 Opiates          300 Cocaine          300 THC              50     Blood Alcohol level:  Lab Results  Component Value Date   ETH <5 74/25/9563    Metabolic Disorder Labs:  No results found for: HGBA1C, MPG No results found for: PROLACTIN No results found for: CHOL, TRIG, HDL, CHOLHDL, VLDL, LDLCALC  Current Medications: Current Facility-Administered Medications  Medication Dose Route Frequency Provider Last Rate Last Dose  . acetaminophen (TYLENOL) tablet 650 mg  650 mg Oral Q6H PRN Harriet Butte, NP   650 mg at 05/17/15 1051  . alum & mag hydroxide-simeth (MAALOX/MYLANTA) 200-200-20 MG/5ML suspension 30 mL  30 mL Oral Q4H PRN Harriet Butte, NP   30 mL at 05/17/15 1126  . busPIRone (BUSPAR) tablet 5 mg  5 mg Oral TID Shuvon B Rankin, NP   5 mg at 05/17/15 1126  . chlordiazePOXIDE (LIBRIUM) capsule 25 mg  25 mg Oral Q6H PRN Shuvon B Rankin, NP      . chlordiazePOXIDE (LIBRIUM) capsule 25 mg  25 mg Oral QID Shuvon B Rankin, NP   25 mg at 05/17/15 1126   Followed by  . [START ON 05/18/2015] chlordiazePOXIDE (LIBRIUM) capsule 25 mg  25 mg Oral TID Shuvon B Rankin, NP       Followed by  . [START ON 05/19/2015] chlordiazePOXIDE (LIBRIUM) capsule 25 mg  25 mg Oral BH-qamhs Shuvon B Rankin, NP       Followed by  . [START ON 05/20/2015] chlordiazePOXIDE (LIBRIUM) capsule 25 mg  25 mg Oral Daily Shuvon B Rankin, NP      . hydrOXYzine (ATARAX/VISTARIL) tablet 25 mg  25 mg Oral TID PRN Shuvon B Rankin, NP   25 mg at 05/17/15 1126  . loperamide (IMODIUM) capsule 2-4 mg  2-4 mg Oral PRN Shuvon B Rankin, NP      . magnesium hydroxide (MILK OF  MAGNESIA) suspension 30 mL  30 mL Oral Daily PRN Harriet Butte, NP      . multivitamin with minerals tablet 1 tablet  1 tablet Oral Daily Shuvon B Rankin, NP   1 tablet at 05/17/15 1203  . ondansetron (ZOFRAN-ODT) disintegrating tablet 4 mg  4 mg Oral Q6H PRN Shuvon B Rankin, NP      . PARoxetine (PAXIL) tablet 20 mg  20 mg Oral Daily Harriet Butte, NP   20 mg at 05/17/15 0745  . traZODone (DESYREL) tablet 50 mg  50 mg Oral QHS PRN,MR X 1 Shuvon B Rankin, NP       PTA Medications: Prescriptions prior to admission  Medication Sig Dispense Refill Last Dose  . ALPRAZolam (XANAX) 0.5 MG tablet Take 0.5-1 mg by mouth daily as needed for anxiety.    05/16/2015 at Unknown time  . calcium carbonate (TUMS EX) 750 MG chewable tablet Chew 2-3 tablets by mouth every 4 (four) hours as needed for heartburn.   05/16/2015 at Unknown time  . PARoxetine (PAXIL) 20 MG tablet Take 20 mg by mouth daily.   05/15/2015 at Unknown time    Musculoskeletal: Strength & Muscle Tone: within normal limits Gait & Station: normal Patient leans: N/A  Psychiatric Specialty Exam: Physical  Exam  Nursing note and vitals reviewed. Constitutional: He is oriented to person, place, and time.  Neck: Normal range of motion.  Respiratory: Effort normal.  Musculoskeletal: Normal range of motion.  Neurological: He is alert and oriented to person, place, and time.  Skin: Skin is warm and dry.  Psychiatric: His speech is normal and behavior is normal. His mood appears anxious. Thought content is not paranoid and not delusional. Cognition and memory are normal. He expresses impulsivity. He exhibits a depressed mood. He expresses no homicidal and no suicidal ideation.    Review of Systems  Neurological: Positive for headaches. Negative for seizures.  Psychiatric/Behavioral: Positive for depression. Negative for suicidal ideas, hallucinations and substance abuse. The patient is nervous/anxious and has insomnia.   All other systems  reviewed and are negative.   Blood pressure 115/80, pulse 75, temperature 98 F (36.7 C), temperature source Oral, resp. rate 18, height 6' 3"  (1.905 m), weight 107.956 kg (238 lb), SpO2 99 %.Body mass index is 29.75 kg/(m^2).  General Appearance: Disheveled  Eye Contact::  Good  Speech:  Clear and Coherent and Normal Rate  Volume:  Normal  Mood:  Anxious and Depressed  Affect:  Depressed and Flat  Thought Process:  Circumstantial and Goal Directed  Orientation:  Full (Time, Place, and Person)  Thought Content:  Denies hallucinations, delusions, and paranoia  Suicidal Thoughts:  No  Homicidal Thoughts:  No  Memory:  Immediate;   Good Recent;   Good Remote;   Good  Judgement:  Fair  Insight:  Fair  Psychomotor Activity:  Decreased and Tremor  Concentration:  Fair  Recall:  Good  Fund of Knowledge:Good  Language: Good  Akathisia:  No  Handed:  Right  AIMS (if indicated):     Assets:  Communication Skills Desire for Improvement Housing Social Support  ADL's:  Intact  Cognition: WNL  Sleep:        Treatment Plan Summary: Medication management, Plan 24 hour observation and start Alcohol detox protocol and TTS will work on scheduling outpatient services and community resources available.    Observation Level/Precautions:  15 minute checks for safety  Laboratory:  Chemistry Profile UDS CBC/Diff, ETOH reviewed from ED; see abnormal values above.  Ordered EKG related to complaints of chest pain with panic attacks.    Psychotherapy:  Individual as appropriate  Medications:  Will start Librium Detox Protocol for Benzodiazapine withdrawal; Buspar 5 mg Tid for Anxiety; Vistaril Tid prn for anxiety/sleep; Trazodone 50 mg Q hs prn repeat dose x 1 for Insomnia.  Will continue Paxil 20 mg for depression/anxiety  (titrate medications as appropriate).   Consultations:  As appropriated  Discharge Concerns:  Safety, stabilization, and access to medication  Estimated LOS:  24 hours  observation and reassess  Other:     I certify that inpatient services furnished can reasonably be expected to improve the patient's condition.    Earleen Newport, NP 2/18/20171:58 PM

## 2015-05-18 DIAGNOSIS — F329 Major depressive disorder, single episode, unspecified: Secondary | ICD-10-CM | POA: Diagnosis not present

## 2015-05-18 DIAGNOSIS — F411 Generalized anxiety disorder: Secondary | ICD-10-CM | POA: Diagnosis not present

## 2015-05-18 DIAGNOSIS — F410 Panic disorder [episodic paroxysmal anxiety] without agoraphobia: Secondary | ICD-10-CM | POA: Diagnosis not present

## 2015-05-18 MED ORDER — HYDROXYZINE HCL 25 MG PO TABS: 25.0000 mg | ORAL_TABLET | Freq: Three times a day (TID) | ORAL | Status: DC | PRN

## 2015-05-18 MED ORDER — BUSPIRONE HCL 5 MG PO TABS: 5.0000 mg | ORAL_TABLET | Freq: Three times a day (TID) | ORAL | Status: DC

## 2015-05-18 MED ORDER — TRAZODONE HCL 50 MG PO TABS: 50.0000 mg | ORAL_TABLET | Freq: Every evening | ORAL | Status: DC | PRN

## 2015-05-18 MED ORDER — PAROXETINE HCL 20 MG PO TABS: 20.0000 mg | ORAL_TABLET | Freq: Every day | ORAL | Status: DC

## 2015-05-18 NOTE — Progress Notes (Signed)
Patient seen on phone pacing the room. Calm, minimal conversation and interaction with peers. Denies SI, HI, AH/VH at this time. Patient requested for something to eat stated  "I didn't eat dinner, the food's not good". Offered and accepted sandwich. Accepted bedtime medications. Encouragement and support given as needed. No further complaint. Patient sleeping at this time. Will continue to monitor and observe patient for safety and stability.

## 2015-05-18 NOTE — Discharge Instructions (Signed)
Patient will be discharged this date and follow up with [facility] for medication management and therapy in [place]. Patient will be discharged with medications and provided with other outpatient resources. This Clinical research associate and patient also discussed the importance of adhering to his medication regimen as we discussion stress management techniques. Patient was also provided a list of recourses to address self care and educated on triggers, anxiety management and panic attacks.

## 2015-05-18 NOTE — Discharge Summary (Signed)
Physician Discharge Summary Note  Patient:  Aaron Meyer is an 45 y.o., male MRN:  [id number] DOB:  December 22, 1970 Patient phone:  [phone] (home)  Patient address:   [address] [place] [address],  Total Time spent with patient: 30 minutes  Date of Admission:  05/17/2015 Date of Discharge: 05/18/15  Reason for Admission:  Per H&P Note:  Aaron Meyer is a Caucasian, married 45 yr old white male with complaints of worsening anxiety and panic attacks for the past 3-4 weeks with headache being one of the stressors. States that he recently started having headaches about a month ago and since the start of the headache his anxiety has gotten worse. Patient states that he has seen his primary physician who restarted Paxil and Xanax. States that he was then referred to [facility] who sent him to [facility]. Patient states that anxiety has gotten so bad that his panic disorder has also worsened. "The only way I can explain to you how I feel is if you are afraid of spiders and there is a glass box of spiders sitting next to you and all of a sudden the glass box breaks and all of the spiders are out surrounding you. That panic you feel when something like that happens is how I feel all the time. I feel like my heart is beating out of my chest, I have chest pain, and hard to breath."  Patient states that he had Xanax left over from 6 months a go 15-16 tablets that he started taking 0.5 mg and would take 2-3 daily. Saw primary MD yesterday and was given a RX for Paxil and Xanax.   Principal Problem: GAD (generalized anxiety disorder) Discharge Diagnoses: Patient Active Problem List   Diagnosis Date Noted  . GAD (generalized anxiety disorder) [F41.1] 05/17/2015  . Panic attack [F41.0] 05/17/2015  . Depression [F32.9]     Past Psychiatric History: General Anxiety Disorder  Past Medical History:  Past Medical History  Diagnosis Date  . Pneumothorax   . Anxiety   . Chronic back pain   .  Panic disorder    History reviewed. No pertinent past surgical history. Family History: History reviewed. No pertinent family history. Family Psychiatric  History: Denies family psych history Social History:  History  Alcohol Use No     History  Drug Use No    Social History   Social History  . Marital Status: Married    Spouse Name: N/A  . Number of Children: N/A  . Years of Education: N/A   Social History Main Topics  . Smoking status: Former Games developer  . Smokeless tobacco: None  . Alcohol Use: No  . Drug Use: No  . Sexual Activity: Not Asked   Other Topics Concern  . None   Social History Narrative    Hospital Course:  Aaron Meyer was admitted to observation for GAD (generalized anxiety disorder) ,medication adjustment, and crisis management.  He was treated with the following medications Buspar 5 mg Tid for Anxiety; Vistaril 25 mg Tid prn for anxiety/sleep; Paxil 20 mg was continued for depression/anxiety.  Was also treated with Librium Detox Protocol for Benzodiazepine withdrawal.  Xanax was discontinued.  Medications were tolerated with no adverse reactions.  Aaron Meyer was discharged with current medication and was instructed on how to take medications as prescribed; (details listed below under Medication List).    Improvement was monitored by observation and Aaron Meyer report of symptom reduction.  Patient states  that he is doing better and feels more comfortable with medications now.  Patient also denied any withdrawal symptoms.  Emotional and mental status was also monitored by staff.          Aaron Meyer was evaluated for stability and plans for continued recovery upon discharge.  Aaron Meyer motivation was an integral factor for scheduling further treatment.  Employment, transportation, bed availability, health status, family support, and any pending legal issues were also considered during his during the 24 hour observation.  He was offered  further treatment options upon discharge including but not limited to Residential, Intensive Outpatient, Outpatient treatment, and resources for shelters if needed.  Aaron Meyer will follow up with the services as listed below under Follow up Information.     Upon completion of this admission the Aaron Meyer was both mentally and medically stable for discharge denying suicidal/homicidal ideation, auditory/visual/tactile hallucinations, delusional thoughts and paranoia.      Physical Findings: AIMS:  , ,  ,  ,    CIWA:    COWS:     Musculoskeletal: Strength & Muscle Tone: within normal limits Gait & Station: normal Patient leans: N/A  Psychiatric Specialty Exam: Review of Systems  HENT:       History headaches  Psychiatric/Behavioral: Positive for substance abuse. Negative for suicidal ideas and hallucinations. Depression: Staqble. Nervous/anxious: Stable. Insomnia: Stable.   All other systems reviewed and are negative.   Blood pressure 130/87, pulse 78, temperature 97.8 F (36.6 C), temperature source Oral, resp. rate 18, height  (1.905 m), weight 107.956 kg (238 lb), SpO2 99 %.Body mass index is 29.75 kg/(m^2).  General Appearance: Disheveled  Eye Contact::  Good  Speech:  Clear and Coherent and Normal Rate  Volume:  Normal  Mood:  Anxious  Affect:  Appropriate  Thought Process:  Goal Directed and Logical  Orientation:  Full (Time, Place, and Person)  Thought Content:  Denies hallucinations, delusions, and paranoia  Suicidal Thoughts:  No  Homicidal Thoughts:  No  Memory:  Immediate;   Good Recent;   Good Remote;   Good  Judgement:  Intact  Insight:  Present  Psychomotor Activity:  Normal  Concentration:  Fair  Recall:  Good  Fund of Knowledge:Good  Language: Good  Akathisia:  No  Handed:  Right  AIMS (if indicated):     Assets:  Communication Skills Desire for Improvement Housing Social Support Transportation  ADL's:  Intact  Cognition: WNL   Sleep:         Has this patient used any form of tobacco in the last 30 days? (Cigarettes, Smokeless Tobacco, Cigars, and/or Pipes) Yes, Yes, A prescription for an FDA-approved tobacco cessation medication was offered at discharge and the patient refused  Blood Alcohol level:  Lab Results  Component Value Date   ETH <5 05/16/2015    Metabolic Disorder Labs:  No results found for: HGBA1C, MPG No results found for: PROLACTIN No results found for: CHOL, TRIG, HDL, CHOLHDL, VLDL, LDLCALC  See Psychiatric Specialty Exam and Suicide Risk Assessment completed by Attending Physician prior to discharge.  Discharge destination:  Home  Is patient on multiple antipsychotic therapies at discharge:  No   Has Patient had three or more failed trials of antipsychotic monotherapy by history:  No  Recommended Plan for Multiple Antipsychotic Therapies: NA      Discharge Instructions    Activity as tolerated - No restrictions    Complete by:  As directed  Diet general    Complete by:  As directed      Discharge instructions    Complete by:  As directed   Take all of you medications as prescribed by your mental healthcare provider.  Report any adverse effects and reactions from your medications to your outpatient provider promptly.  Do not engage in alcohol and or illegal drug use while on prescription medicines. Keep all scheduled appointments. This is to ensure that you are getting refills on time and to avoid any interruption in your medication.  If you are unable to keep an appointment call to reschedule.  Be sure to follow up with resources and follow ups given. In the event of worsening symptoms call the crisis hotline, 911, and or go to the nearest emergency department for appropriate evaluation and treatment of symptoms. Follow-up with your primary care provider for your medical issues, concerns and or health care needs.            Medication List    STOP taking these medications         ALPRAZolam 0.5 MG tablet  Commonly known as:  XANAX      TAKE these medications      Indication   busPIRone 5 MG tablet  Commonly known as:  BUSPAR  Take 1 tablet (5 mg total) by mouth 3 (three) times daily.   Indication:  Generalized Anxiety Disorder     calcium carbonate 750 MG chewable tablet  Commonly known as:  TUMS EX  Chew 2-3 tablets by mouth every 4 (four) hours as needed for heartburn.      hydrOXYzine 25 MG tablet  Commonly known as:  ATARAX/VISTARIL  Take 1 tablet (25 mg total) by mouth 3 (three) times daily as needed for anxiety (sleep).      PARoxetine 20 MG tablet  Commonly known as:  PAXIL  Take 1 tablet (20 mg total) by mouth daily.   Indication:  Generalized Anxiety Disorder, Major Depressive Disorder     traZODone 50 MG tablet  Commonly known as:  DESYREL  Take 1 tablet (50 mg total) by mouth at bedtime as needed for sleep.        Follow-up Information    Follow up with [facility]. Go in 1 day.   Why:  For follow up and medication management   Contact information:   [address] [place], [place] [address] [phone]      Follow-up recommendations:  Activity:  As tolerated Diet:  As tolerated  Comments:  Aaron Yeiden Frenkel has been instructed to take medications as prescribed; and report adverse effects to outpatient provider.  Follow up with primary doctor for any medical issues and If symptoms recur report to nearest emergency or crisis hot line.    SignedAssunta Found, NP 05/18/2015, 11:09 AM

## 2015-05-18 NOTE — Progress Notes (Signed)
Patient discharged to home with his wife. Pt with no belongings at hospital. Pt with no s/s of distress noted at this time. Pt verbalizes an understanding of discharge instructions and medications.

## 2016-07-28 DIAGNOSIS — F341 Dysthymic disorder: Secondary | ICD-10-CM | POA: Diagnosis not present

## 2016-07-28 DIAGNOSIS — K219 Gastro-esophageal reflux disease without esophagitis: Secondary | ICD-10-CM | POA: Diagnosis not present

## 2017-02-16 DIAGNOSIS — F341 Dysthymic disorder: Secondary | ICD-10-CM | POA: Diagnosis not present

## 2017-02-16 DIAGNOSIS — L723 Sebaceous cyst: Secondary | ICD-10-CM | POA: Diagnosis not present

## 2017-02-16 DIAGNOSIS — R799 Abnormal finding of blood chemistry, unspecified: Secondary | ICD-10-CM | POA: Diagnosis not present

## 2017-02-16 DIAGNOSIS — R5383 Other fatigue: Secondary | ICD-10-CM | POA: Diagnosis not present

## 2017-02-16 DIAGNOSIS — K219 Gastro-esophageal reflux disease without esophagitis: Secondary | ICD-10-CM | POA: Diagnosis not present

## 2017-02-16 DIAGNOSIS — M25511 Pain in right shoulder: Secondary | ICD-10-CM | POA: Diagnosis not present

## 2017-03-09 DIAGNOSIS — M25511 Pain in right shoulder: Secondary | ICD-10-CM | POA: Diagnosis not present

## 2017-03-14 DIAGNOSIS — J342 Deviated nasal septum: Secondary | ICD-10-CM | POA: Diagnosis not present

## 2017-03-14 DIAGNOSIS — M948X9 Other specified disorders of cartilage, unspecified sites: Secondary | ICD-10-CM | POA: Diagnosis not present

## 2017-03-14 DIAGNOSIS — Z872 Personal history of diseases of the skin and subcutaneous tissue: Secondary | ICD-10-CM | POA: Diagnosis not present

## 2017-03-28 DIAGNOSIS — J208 Acute bronchitis due to other specified organisms: Secondary | ICD-10-CM | POA: Diagnosis not present

## 2017-04-06 DIAGNOSIS — E782 Mixed hyperlipidemia: Secondary | ICD-10-CM | POA: Diagnosis not present

## 2017-04-06 DIAGNOSIS — R740 Nonspecific elevation of levels of transaminase and lactic acid dehydrogenase [LDH]: Secondary | ICD-10-CM | POA: Diagnosis not present

## 2017-04-06 DIAGNOSIS — R799 Abnormal finding of blood chemistry, unspecified: Secondary | ICD-10-CM | POA: Diagnosis not present

## 2017-04-07 LAB — HM HEPATITIS C SCREENING LAB: HM Hepatitis Screen: NEGATIVE

## 2017-04-14 DIAGNOSIS — R945 Abnormal results of liver function studies: Secondary | ICD-10-CM | POA: Diagnosis not present

## 2017-04-18 DIAGNOSIS — M25511 Pain in right shoulder: Secondary | ICD-10-CM | POA: Diagnosis not present

## 2017-04-18 DIAGNOSIS — M7591 Shoulder lesion, unspecified, right shoulder: Secondary | ICD-10-CM | POA: Diagnosis not present

## 2017-04-21 DIAGNOSIS — M25511 Pain in right shoulder: Secondary | ICD-10-CM | POA: Diagnosis not present

## 2017-06-09 ENCOUNTER — Encounter (HOSPITAL_COMMUNITY): Payer: Self-pay | Admitting: *Deleted

## 2017-08-18 DIAGNOSIS — J00 Acute nasopharyngitis [common cold]: Secondary | ICD-10-CM | POA: Diagnosis not present

## 2017-08-18 DIAGNOSIS — R740 Nonspecific elevation of levels of transaminase and lactic acid dehydrogenase [LDH]: Secondary | ICD-10-CM | POA: Diagnosis not present

## 2017-08-18 DIAGNOSIS — Z202 Contact with and (suspected) exposure to infections with a predominantly sexual mode of transmission: Secondary | ICD-10-CM | POA: Diagnosis not present

## 2017-08-25 DIAGNOSIS — F341 Dysthymic disorder: Secondary | ICD-10-CM | POA: Diagnosis not present

## 2017-08-25 DIAGNOSIS — K219 Gastro-esophageal reflux disease without esophagitis: Secondary | ICD-10-CM | POA: Diagnosis not present

## 2017-08-25 DIAGNOSIS — R740 Nonspecific elevation of levels of transaminase and lactic acid dehydrogenase [LDH]: Secondary | ICD-10-CM | POA: Diagnosis not present

## 2017-09-27 DIAGNOSIS — M545 Low back pain: Secondary | ICD-10-CM | POA: Diagnosis not present

## 2017-10-04 DIAGNOSIS — M5416 Radiculopathy, lumbar region: Secondary | ICD-10-CM | POA: Diagnosis not present

## 2017-10-04 DIAGNOSIS — M545 Low back pain: Secondary | ICD-10-CM | POA: Diagnosis not present

## 2017-10-06 DIAGNOSIS — M545 Low back pain: Secondary | ICD-10-CM | POA: Diagnosis not present

## 2018-02-02 DIAGNOSIS — H5310 Unspecified subjective visual disturbances: Secondary | ICD-10-CM | POA: Diagnosis not present

## 2018-02-02 DIAGNOSIS — R5383 Other fatigue: Secondary | ICD-10-CM | POA: Diagnosis not present

## 2018-02-02 DIAGNOSIS — H539 Unspecified visual disturbance: Secondary | ICD-10-CM | POA: Diagnosis not present

## 2018-02-10 DIAGNOSIS — H538 Other visual disturbances: Secondary | ICD-10-CM | POA: Diagnosis not present

## 2018-02-22 DIAGNOSIS — H539 Unspecified visual disturbance: Secondary | ICD-10-CM | POA: Diagnosis not present

## 2018-02-22 DIAGNOSIS — H538 Other visual disturbances: Secondary | ICD-10-CM | POA: Diagnosis not present

## 2018-05-09 DIAGNOSIS — K219 Gastro-esophageal reflux disease without esophagitis: Secondary | ICD-10-CM | POA: Diagnosis not present

## 2018-05-09 DIAGNOSIS — F341 Dysthymic disorder: Secondary | ICD-10-CM | POA: Diagnosis not present

## 2018-05-09 DIAGNOSIS — R740 Nonspecific elevation of levels of transaminase and lactic acid dehydrogenase [LDH]: Secondary | ICD-10-CM | POA: Diagnosis not present

## 2018-05-09 DIAGNOSIS — R799 Abnormal finding of blood chemistry, unspecified: Secondary | ICD-10-CM | POA: Diagnosis not present

## 2018-05-22 DIAGNOSIS — R945 Abnormal results of liver function studies: Secondary | ICD-10-CM | POA: Diagnosis not present

## 2018-05-22 DIAGNOSIS — K219 Gastro-esophageal reflux disease without esophagitis: Secondary | ICD-10-CM | POA: Diagnosis not present

## 2018-06-05 DIAGNOSIS — R945 Abnormal results of liver function studies: Secondary | ICD-10-CM | POA: Diagnosis not present

## 2019-01-18 DIAGNOSIS — S56911A Strain of unspecified muscles, fascia and tendons at forearm level, right arm, initial encounter: Secondary | ICD-10-CM | POA: Diagnosis not present

## 2019-01-18 DIAGNOSIS — S59911A Unspecified injury of right forearm, initial encounter: Secondary | ICD-10-CM | POA: Diagnosis not present

## 2019-02-08 DIAGNOSIS — J069 Acute upper respiratory infection, unspecified: Secondary | ICD-10-CM | POA: Diagnosis not present

## 2019-02-13 DIAGNOSIS — F341 Dysthymic disorder: Secondary | ICD-10-CM | POA: Diagnosis not present

## 2019-02-13 DIAGNOSIS — K219 Gastro-esophageal reflux disease without esophagitis: Secondary | ICD-10-CM | POA: Diagnosis not present

## 2019-02-13 DIAGNOSIS — U071 COVID-19: Secondary | ICD-10-CM | POA: Diagnosis not present

## 2019-07-17 DIAGNOSIS — Z23 Encounter for immunization: Secondary | ICD-10-CM | POA: Diagnosis not present

## 2019-07-27 ENCOUNTER — Encounter: Payer: Self-pay | Admitting: Physician Assistant

## 2019-07-27 ENCOUNTER — Other Ambulatory Visit: Payer: Self-pay

## 2019-07-27 ENCOUNTER — Ambulatory Visit (INDEPENDENT_AMBULATORY_CARE_PROVIDER_SITE_OTHER): Payer: Medicare Other | Admitting: Physician Assistant

## 2019-07-27 VITALS — BP 124/78 | HR 67 | Temp 97.8°F | Ht 75.0 in | Wt 228.0 lb

## 2019-07-27 DIAGNOSIS — K219 Gastro-esophageal reflux disease without esophagitis: Secondary | ICD-10-CM | POA: Insufficient documentation

## 2019-07-27 DIAGNOSIS — L821 Other seborrheic keratosis: Secondary | ICD-10-CM | POA: Diagnosis not present

## 2019-07-27 DIAGNOSIS — F418 Other specified anxiety disorders: Secondary | ICD-10-CM

## 2019-07-27 MED ORDER — ALPRAZOLAM 0.5 MG PO TABS: 0.5000 mg | ORAL_TABLET | Freq: Every evening | ORAL | 2 refills | Status: DC | PRN

## 2019-07-27 MED ORDER — PAROXETINE HCL 20 MG PO TABS: 20.0000 mg | ORAL_TABLET | Freq: Every day | ORAL | 1 refills | Status: DC

## 2019-07-27 MED ORDER — BUSPIRONE HCL 5 MG PO TABS: 5.0000 mg | ORAL_TABLET | Freq: Two times a day (BID) | ORAL | 1 refills | Status: DC

## 2019-07-27 MED ORDER — FAMOTIDINE 40 MG PO TABS: 40.0000 mg | ORAL_TABLET | Freq: Every day | ORAL | 1 refills | Status: DC

## 2019-07-27 NOTE — Assessment & Plan Note (Signed)
Continue current meds 

## 2019-07-27 NOTE — Progress Notes (Signed)
Established Patient Office Visit  Subjective:  Patient ID: Aaron Meyer, male    DOB: 02-08-1971  Age: 49 y.o. MRN: [id number]  CC:  Chief Complaint  Patient presents with  . Gastroesophageal Reflux  . Depression    HPI Aaron Meyer presents for follow up GERD   Follow up of gastro-esophageal reflux disease without esophagitis.  pt states he is doing well on pepcid with no breakthrough symptoms -    Follow up of dysthymic disorder.  pt states he is doing well on his current meds - no breakthrough symptoms and requests refill of all meds --- he is having no side effects of the paxil or buspar denies mood swings, anxiety, crying spells, etc - he also uses xanax as needed  Pt has some lesions on face and shoulder - would like to be referred to dermatology Past Medical History:  Diagnosis Date  . Anxiety    takes Xanax as needed  . Anxiety   . Chronic back pain    herniated disc,ddd,spondylosis and radiculopathy  . Chronic back pain   . Depression    takes Paxil daily  . Depression with anxiety   . GERD (gastroesophageal reflux disease)    takes Dexilant daily  . GERD without esophagitis   . Hemorrhoids   . Panic disorder   . Pneumothorax    couple of times but didn't require a chest tube;age 48 and 26  . Pneumothorax     Past Surgical History:  Procedure Laterality Date  . ESOPHAGOGASTRODUODENOSCOPY    . HERNIA REPAIR     umbilical as a baby  . LUMBAR LAMINECTOMY/DECOMPRESSION MICRODISCECTOMY  04/22/2011   Procedure: LUMBAR LAMINECTOMY/DECOMPRESSION MICRODISCECTOMY;  Surgeon: Hewitt Shorts, MD;  Location: [facility];  Service: Neurosurgery;  Laterality: Left;  LEFT Lumbar five sacal one laminotomy and microdiskectomy  . LUMBAR LAMINECTOMY/DECOMPRESSION MICRODISCECTOMY  05/05/2011   Procedure: LUMBAR LAMINECTOMY/DECOMPRESSION MICRODISCECTOMY;  Surgeon: Hewitt Shorts, MD;  Location: [facility];  Service: Neurosurgery;  Laterality: N/A;   lumbar laminotomy and repair of Cerebrospinal Fluid Leak    Family History  Problem Relation Age of Onset  . Anesthesia problems Neg Hx   . Hypotension Neg Hx   . Malignant hyperthermia Neg Hx   . Pseudochol deficiency Neg Hx     Social History   Socioeconomic History  . Marital status: Married    Spouse name: Not on file  . Number of children: Not on file  . Years of education: Not on file  . Highest education level: Not on file  Occupational History  . Not on file  Tobacco Use  . Smoking status: Former Games developer  . Smokeless tobacco: Never Used  Substance and Sexual Activity  . Alcohol use: No  . Drug use: No  . Sexual activity: Yes  Other Topics Concern  . Not on file  Social History Narrative   ** Merged History Encounter **       Social Determinants of Health   Financial Resource Strain:   . Difficulty of Paying Living Expenses:   Food Insecurity:   . Worried About Programme researcher, broadcasting/film/video in the Last Year:   . Barista in the Last Year:   Transportation Needs:   . Freight forwarder (Medical):   Marland Kitchen Lack of Transportation (Non-Medical):   Physical Activity:   . Days of Exercise per Week:   . Minutes of Exercise per Session:   Stress:   . Feeling  of Stress :   Social Connections:   . Frequency of Communication with Friends and Family:   . Frequency of Social Gatherings with Friends and Family:   . Attends Religious Services:   . Active Member of Clubs or Organizations:   . Attends Archivist Meetings:   Marland Kitchen Marital Status:   Intimate Partner Violence:   . Fear of Current or Ex-Partner:   . Emotionally Abused:   Marland Kitchen Physically Abused:   . Sexually Abused:      Current Outpatient Medications:  .  ALPRAZolam (XANAX) 0.5 MG tablet, Take 0.5 mg by mouth 2 (two) times daily as needed. For sleep and anxiety, Disp: , Rfl:  .  busPIRone (BUSPAR) 5 MG tablet, Take 1 tablet (5 mg total) by mouth 3 (three) times daily., Disp: 30 tablet, Rfl: 0 .   famotidine (PEPCID) 40 MG tablet, Take 40 mg by mouth at bedtime., Disp: , Rfl:  .  PARoxetine (PAXIL) 20 MG tablet, Take 1 tablet (20 mg total) by mouth daily., Disp: 30 tablet, Rfl: 0 No current facility-administered medications for this visit.  Facility-Administered Medications Ordered in Other Visits:  .  pantoprazole (PROTONIX) EC tablet 40 mg, 40 mg, Oral, Q1200, Leeroy Cha, MD .  PARoxetine (PAXIL) tablet 20 mg, 20 mg, Oral, QPM, Leeroy Cha, MD   Allergies  Allergen Reactions  . Biaxin [Clarithromycin] Nausea And Vomiting  . Clarithromycin Nausea And Vomiting  . Eggs Or Egg-Derived Products Hives  . Shrimp [Shellfish Allergy] Hives  . Other Other (See Comments) and Rash    Eggs and shrimp (hives) Scratchy Throat    ROS CONSTITUTIONAL: Negative for chills, fatigue, fever, unintentional weight gain and unintentional weight loss.   CARDIOVASCULAR: Negative for chest pain, dizziness, palpitations and pedal edema.  RESPIRATORY: Negative for recent cough and dyspnea.  GASTROINTESTINAL: Negative for abdominal pain, acid reflux symptoms, constipation, diarrhea, nausea and vomiting.  MSK: pt with chronic back pain INTEGUMENTARY: see HPI NEUROLOGICAL: Negative for dizziness and headaches.  PSYCHIATRIC: Negative for sleep disturbance and to question depression screen.  Negative for depression, negative for anhedonia.        Objective:    PHYSICAL EXAM:   VS: BP 124/78   Pulse 67   Temp 97.8 F (36.6 C)   Ht 6\' 3"  (1.905 m)   Wt 228 lb (103.4 kg)   SpO2 97%   BMI 28.50 kg/m   GEN: Well nourished, well developed, in no acute distress   Cardiac: RRR; no murmurs, rubs, or gallops,no edema - no significant varicosities Respiratory:  normal respiratory rate and pattern with no distress - normal breath sounds with no rales, rhonchi, wheezes or rubs  Skin: warm and dry, SK noted on face and left shoulder Neuro:  Alert and Oriented x 3, Strength and sensation are  intact - CN II-Xii grossly intact Psych: euthymic mood, appropriate affect and demeanor  BP 124/78   Pulse 67   Temp 97.8 F (36.6 C)   Ht 6\' 3"  (1.905 m)   Wt 228 lb (103.4 kg)   SpO2 97%   BMI 28.50 kg/m  Wt Readings from Last 3 Encounters:  07/27/19 228 lb (103.4 kg)  04/27/11 203 lb 4.2 oz (92.2 kg)  04/20/11 213 lb 3 oz (96.7 kg)     Health Maintenance Due  Topic Date Due  . HIV Screening  Never done  . COVID-19 Vaccine (1) Never done  . TETANUS/TDAP  Never done  . COLONOSCOPY  Never done  There are no preventive care reminders to display for this patient.  No results found for: TSH Lab Results  Component Value Date   WBC 7.4 05/16/2015   HGB 16.0 05/16/2015   HCT 46.0 05/16/2015   MCV 79.9 05/16/2015   PLT 170 05/16/2015   Lab Results  Component Value Date   NA 138 05/16/2015   K 3.6 05/16/2015   CO2 21 (L) 05/16/2015   GLUCOSE 124 (H) 05/16/2015   BUN 15 05/16/2015   CREATININE 1.22 05/16/2015   BILITOT 1.2 05/16/2015   ALKPHOS 63 05/16/2015   AST 45 (H) 05/16/2015   ALT 53 05/16/2015   PROT 6.8 05/16/2015   ALBUMIN 3.9 05/16/2015   CALCIUM 9.7 05/16/2015   ANIONGAP 12 05/16/2015   No results found for: CHOL No results found for: HDL No results found for: LDLCALC No results found for: TRIG No results found for: CHOLHDL No results found for: RCVE9F    Assessment & Plan:   Problem List Items Addressed This Visit      Digestive   GERD without esophagitis - Primary   Relevant Medications   famotidine (PEPCID) 40 MG tablet     Musculoskeletal and Integument   Seborrheic keratoses     Other   Depression with anxiety      No orders of the defined types were placed in this encounter.   Follow-up: Return in about 6 months (around 01/26/2020) for fasting physical.    SARA R Javohn Basey, PA-C

## 2019-07-27 NOTE — Assessment & Plan Note (Signed)
Referral to dermatology.  

## 2019-08-14 DIAGNOSIS — Z23 Encounter for immunization: Secondary | ICD-10-CM | POA: Diagnosis not present

## 2019-08-15 DIAGNOSIS — L821 Other seborrheic keratosis: Secondary | ICD-10-CM | POA: Diagnosis not present

## 2019-08-15 DIAGNOSIS — L814 Other melanin hyperpigmentation: Secondary | ICD-10-CM | POA: Diagnosis not present

## 2019-08-15 DIAGNOSIS — L570 Actinic keratosis: Secondary | ICD-10-CM | POA: Diagnosis not present

## 2019-08-15 DIAGNOSIS — L578 Other skin changes due to chronic exposure to nonionizing radiation: Secondary | ICD-10-CM | POA: Diagnosis not present

## 2019-10-12 DIAGNOSIS — H25813 Combined forms of age-related cataract, bilateral: Secondary | ICD-10-CM | POA: Diagnosis not present

## 2019-10-12 DIAGNOSIS — H538 Other visual disturbances: Secondary | ICD-10-CM | POA: Diagnosis not present

## 2019-10-12 DIAGNOSIS — H21233 Degeneration of iris (pigmentary), bilateral: Secondary | ICD-10-CM | POA: Diagnosis not present

## 2019-10-12 DIAGNOSIS — H40013 Open angle with borderline findings, low risk, bilateral: Secondary | ICD-10-CM | POA: Diagnosis not present

## 2019-12-28 ENCOUNTER — Ambulatory Visit: Payer: Medicare Other | Admitting: Physician Assistant

## 2020-01-02 ENCOUNTER — Other Ambulatory Visit: Payer: Self-pay

## 2020-01-02 ENCOUNTER — Encounter: Payer: Self-pay | Admitting: Physician Assistant

## 2020-01-02 ENCOUNTER — Ambulatory Visit (INDEPENDENT_AMBULATORY_CARE_PROVIDER_SITE_OTHER): Payer: Medicare Other | Admitting: Physician Assistant

## 2020-01-02 VITALS — BP 126/78 | HR 75 | Temp 98.0°F | Ht 75.0 in | Wt 223.0 lb

## 2020-01-02 DIAGNOSIS — F418 Other specified anxiety disorders: Secondary | ICD-10-CM | POA: Diagnosis not present

## 2020-01-02 DIAGNOSIS — M158 Other polyosteoarthritis: Secondary | ICD-10-CM | POA: Diagnosis not present

## 2020-01-02 DIAGNOSIS — M159 Polyosteoarthritis, unspecified: Secondary | ICD-10-CM | POA: Insufficient documentation

## 2020-01-02 DIAGNOSIS — K219 Gastro-esophageal reflux disease without esophagitis: Secondary | ICD-10-CM

## 2020-01-02 MED ORDER — CELECOXIB 200 MG PO CAPS: 200.0000 mg | ORAL_CAPSULE | Freq: Every day | ORAL | 5 refills | Status: DC

## 2020-01-02 NOTE — Progress Notes (Signed)
Established Patient Office Visit  Subjective:  Patient ID: Aaron Meyer, male    DOB: 1970-12-29  Age: 49 y.o. MRN: [id number]  CC:  Chief Complaint  Patient presents with  . Anxiety    HPI Aaron Meyer presents for follow up GERD   Follow up of gastro-esophageal reflux disease without esophagitis.  pt states he is doing well on pepcid with no breakthrough symptoms -    Follow up of dysthymic disorder.  pt states he is doing well on his current meds - no breakthrough symptoms  --- he is having no side effects of the paxil or buspar denies mood swings, anxiety, crying spells, etc - he also uses xanax as needed  Pt complains of osteoarthritis - has had chronic trouble with back and hands - is currently not on any medication Past Medical History:  Diagnosis Date  . Anxiety    takes Xanax as needed  . Anxiety   . Chronic back pain    herniated disc,ddd,spondylosis and radiculopathy  . Chronic back pain   . Depression    takes Paxil daily  . Depression with anxiety   . GERD (gastroesophageal reflux disease)    takes Dexilant daily  . GERD without esophagitis   . Hemorrhoids   . Panic disorder   . Pneumothorax    couple of times but didn't require a chest tube;age 71 and 26  . Pneumothorax     Past Surgical History:  Procedure Laterality Date  . ESOPHAGOGASTRODUODENOSCOPY    . HERNIA REPAIR     umbilical as a baby  . LUMBAR LAMINECTOMY/DECOMPRESSION MICRODISCECTOMY  04/22/2011   Procedure: LUMBAR LAMINECTOMY/DECOMPRESSION MICRODISCECTOMY;  Surgeon: Hewitt Shorts, MD;  Location: [facility];  Service: Neurosurgery;  Laterality: Left;  LEFT Lumbar five sacal one laminotomy and microdiskectomy  . LUMBAR LAMINECTOMY/DECOMPRESSION MICRODISCECTOMY  05/05/2011   Procedure: LUMBAR LAMINECTOMY/DECOMPRESSION MICRODISCECTOMY;  Surgeon: Hewitt Shorts, MD;  Location: [facility];  Service: Neurosurgery;  Laterality: N/A;  lumbar laminotomy and repair of  Cerebrospinal Fluid Leak    Family History  Problem Relation Age of Onset  . Anesthesia problems Neg Hx   . Hypotension Neg Hx   . Malignant hyperthermia Neg Hx   . Pseudochol deficiency Neg Hx     Social History   Socioeconomic History  . Marital status: Married    Spouse name: Not on file  . Number of children: Not on file  . Years of education: Not on file  . Highest education level: Not on file  Occupational History  . Not on file  Tobacco Use  . Smoking status: Former Games developer  . Smokeless tobacco: Never Used  Substance and Sexual Activity  . Alcohol use: No  . Drug use: No  . Sexual activity: Yes  Other Topics Concern  . Not on file  Social History Narrative   ** Merged History Encounter **       Social Determinants of Health   Financial Resource Strain:   . Difficulty of Paying Living Expenses: Not on file  Food Insecurity:   . Worried About Programme researcher, broadcasting/film/video in the Last Year: Not on file  . Ran Out of Food in the Last Year: Not on file  Transportation Needs:   . Lack of Transportation (Medical): Not on file  . Lack of Transportation (Non-Medical): Not on file  Physical Activity:   . Days of Exercise per Week: Not on file  . Minutes of Exercise per Session:  Not on file  Stress:   . Feeling of Stress : Not on file  Social Connections:   . Frequency of Communication with Friends and Family: Not on file  . Frequency of Social Gatherings with Friends and Family: Not on file  . Attends Religious Services: Not on file  . Active Member of Clubs or Organizations: Not on file  . Attends Banker Meetings: Not on file  . Marital Status: Not on file  Intimate Partner Violence:   . Fear of Current or Ex-Partner: Not on file  . Emotionally Abused: Not on file  . Physically Abused: Not on file  . Sexually Abused: Not on file     Current Outpatient Medications:  .  ALPRAZolam (XANAX) 0.5 MG tablet, Take 1 tablet (0.5 mg total) by mouth at bedtime  as needed. For sleep and anxiety, Disp: 30 tablet, Rfl: 2 .  busPIRone (BUSPAR) 5 MG tablet, Take 1 tablet (5 mg total) by mouth 2 (two) times daily., Disp: 180 tablet, Rfl: 1 .  famotidine (PEPCID) 40 MG tablet, Take 1 tablet (40 mg total) by mouth daily., Disp: 90 tablet, Rfl: 1 .  PARoxetine (PAXIL) 20 MG tablet, Take 1 tablet (20 mg total) by mouth daily., Disp: 90 tablet, Rfl: 1 .  celecoxib (CELEBREX) 200 MG capsule, Take 1 capsule (200 mg total) by mouth daily., Disp: 30 capsule, Rfl: 5 No current facility-administered medications for this visit.  Facility-Administered Medications Ordered in Other Visits:  .  pantoprazole (PROTONIX) EC tablet 40 mg, 40 mg, Oral, Q1200, Hilda Lias, MD .  PARoxetine (PAXIL) tablet 20 mg, 20 mg, Oral, QPM, Hilda Lias, MD   Allergies  Allergen Reactions  . Biaxin [Clarithromycin] Nausea And Vomiting  . Clarithromycin Nausea And Vomiting  . Eggs Or Egg-Derived Products Hives  . Shrimp [Shellfish Allergy] Hives  . Other Other (See Comments) and Rash    Eggs and shrimp (hives) Scratchy Throat    ROS CONSTITUTIONAL: Negative for chills, fatigue, fever, unintentional weight gain and unintentional weight loss.   CARDIOVASCULAR: Negative for chest pain, dizziness, palpitations and pedal edema.  RESPIRATORY: Negative for recent cough and dyspnea.  GASTROINTESTINAL: Negative for abdominal pain, acid reflux symptoms, constipation, diarrhea, nausea and vomiting.  MSK: pt with chronic back pain/hand pain INTEGUMENTARY: see HPI PSYCHIATRIC: Negative for sleep disturbance and to question depression screen.  Negative for depression, negative for anhedonia.        Objective:    PHYSICAL EXAM:   VS: BP 126/78 (BP Location: Left Arm, Patient Position: Sitting, Cuff Size: Normal)   Pulse 75   Temp 98 F (36.7 C) (Temporal)   Ht 6\' 3"  (1.905 m)   Wt 223 lb (101.2 kg)   SpO2 99%   BMI 27.87 kg/m   GEN: Well nourished, well developed, in no  acute distress   Cardiac: RRR; no murmurs, rubs, or gallops,no edema - no significant varicosities Respiratory:  normal respiratory rate and pattern with no distress - normal breath sounds with no rales, rhonchi, wheezes or rubs  Skin: warm and dry musc - small nodules on hands Neuro:  Alert and Oriented x 3, Strength and sensation are intact - CN II-Xii grossly intact Psych: euthymic mood, appropriate affect and demeanor  BP 126/78 (BP Location: Left Arm, Patient Position: Sitting, Cuff Size: Normal)   Pulse 75   Temp 98 F (36.7 C) (Temporal)   Ht 6\' 3"  (1.905 m)   Wt 223 lb (101.2 kg)   SpO2 99%  BMI 27.87 kg/m  Wt Readings from Last 3 Encounters:  01/02/20 223 lb (101.2 kg)  07/27/19 228 lb (103.4 kg)  04/27/11 203 lb 4.2 oz (92.2 kg)     Health Maintenance Due  Topic Date Due  . Hepatitis C Screening  Never done  . COVID-19 Vaccine (1) Never done  . HIV Screening  Never done  . TETANUS/TDAP  Never done  . COLONOSCOPY  Never done  . INFLUENZA VACCINE  Never done    There are no preventive care reminders to display for this patient.  No results found for: TSH Lab Results  Component Value Date   WBC 7.4 05/16/2015   HGB 16.0 05/16/2015   HCT 46.0 05/16/2015   MCV 79.9 05/16/2015   PLT 170 05/16/2015   Lab Results  Component Value Date   NA 138 05/16/2015   K 3.6 05/16/2015   CO2 21 (L) 05/16/2015   GLUCOSE 124 (H) 05/16/2015   BUN 15 05/16/2015   CREATININE 1.22 05/16/2015   BILITOT 1.2 05/16/2015   ALKPHOS 63 05/16/2015   AST 45 (H) 05/16/2015   ALT 53 05/16/2015   PROT 6.8 05/16/2015   ALBUMIN 3.9 05/16/2015   CALCIUM 9.7 05/16/2015   ANIONGAP 12 05/16/2015   No results found for: CHOL No results found for: HDL No results found for: LDLCALC No results found for: TRIG No results found for: CHOLHDL No results found for: OEHO1Y    Assessment & Plan:   Problem List Items Addressed This Visit      Digestive   GERD without esophagitis -  Primary    Continue current meds as directed        Musculoskeletal and Integument   Degenerative joint disease involving multiple joints    Trial celebrex 200mg  qd      Relevant Medications   celecoxib (CELEBREX) 200 MG capsule     Other   Depression with anxiety    Continue current meds as directed         Meds ordered this encounter  Medications  . celecoxib (CELEBREX) 200 MG capsule    Sig: Take 1 capsule (200 mg total) by mouth daily.    Dispense:  30 capsule    Refill:  5    Order Specific Question:   Supervising Provider    Answer:      Follow-up: Return in about 6 months (around 07/02/2020) for chronic fasting follow up.   Pt defers labwork today SARA R Zeya Balles, PA-C

## 2020-01-02 NOTE — Assessment & Plan Note (Signed)
Continue current meds as directed 

## 2020-01-02 NOTE — Assessment & Plan Note (Signed)
Trial celebrex 200mg  qd

## 2020-02-08 ENCOUNTER — Other Ambulatory Visit: Payer: Self-pay | Admitting: Physician Assistant

## 2020-02-08 DIAGNOSIS — F418 Other specified anxiety disorders: Secondary | ICD-10-CM

## 2020-02-11 ENCOUNTER — Telehealth: Payer: Self-pay

## 2020-02-11 ENCOUNTER — Other Ambulatory Visit: Payer: Self-pay

## 2020-02-11 DIAGNOSIS — M158 Other polyosteoarthritis: Secondary | ICD-10-CM

## 2020-02-11 DIAGNOSIS — K219 Gastro-esophageal reflux disease without esophagitis: Secondary | ICD-10-CM

## 2020-02-11 DIAGNOSIS — F418 Other specified anxiety disorders: Secondary | ICD-10-CM

## 2020-02-11 MED ORDER — BUSPIRONE HCL 5 MG PO TABS: 5.0000 mg | ORAL_TABLET | Freq: Two times a day (BID) | ORAL | 1 refills | Status: DC

## 2020-02-11 MED ORDER — ALPRAZOLAM 0.5 MG PO TABS: 0.5000 mg | ORAL_TABLET | Freq: Every evening | ORAL | 2 refills | Status: DC | PRN

## 2020-02-11 MED ORDER — FAMOTIDINE 40 MG PO TABS: 40.0000 mg | ORAL_TABLET | Freq: Every day | ORAL | 1 refills | Status: DC

## 2020-02-11 MED ORDER — PAROXETINE HCL 20 MG PO TABS: 20.0000 mg | ORAL_TABLET | Freq: Every day | ORAL | 1 refills | Status: DC

## 2020-02-11 MED ORDER — CELECOXIB 200 MG PO CAPS: 200.0000 mg | ORAL_CAPSULE | Freq: Every day | ORAL | 5 refills | Status: DC

## 2020-02-11 NOTE — Telephone Encounter (Signed)
Pt called this morning stating that he has contacted the pharmacy along with contacting the office on Friday for a medication refill. Pt is requesting a refill on his Pepcid, Paxil, Xanax, Buspirone, and Celebrex be sent to [facility]. These medication match the information that we have in the pts chart.

## 2020-02-11 NOTE — Telephone Encounter (Signed)
Meds pended and waiting for approval or denial

## 2020-04-03 ENCOUNTER — Ambulatory Visit: Payer: Medicare Other

## 2020-05-22 ENCOUNTER — Encounter: Payer: Self-pay | Admitting: Physician Assistant

## 2020-07-02 ENCOUNTER — Ambulatory Visit: Payer: Medicare Other | Admitting: Physician Assistant

## 2020-08-06 ENCOUNTER — Other Ambulatory Visit: Payer: Self-pay | Admitting: Family Medicine

## 2020-08-06 DIAGNOSIS — F418 Other specified anxiety disorders: Secondary | ICD-10-CM

## 2020-08-13 ENCOUNTER — Other Ambulatory Visit: Payer: Self-pay | Admitting: Family Medicine

## 2020-08-13 DIAGNOSIS — F418 Other specified anxiety disorders: Secondary | ICD-10-CM

## 2020-08-14 ENCOUNTER — Other Ambulatory Visit: Payer: Self-pay

## 2020-08-14 DIAGNOSIS — F418 Other specified anxiety disorders: Secondary | ICD-10-CM

## 2020-08-14 MED ORDER — ALPRAZOLAM 0.5 MG PO TABS: 0.5000 mg | ORAL_TABLET | Freq: Every evening | ORAL | 2 refills | Status: DC | PRN

## 2020-08-14 MED ORDER — PAROXETINE HCL 20 MG PO TABS: 20.0000 mg | ORAL_TABLET | Freq: Every day | ORAL | 1 refills | Status: DC

## 2020-08-14 MED ORDER — BUSPIRONE HCL 5 MG PO TABS: 5.0000 mg | ORAL_TABLET | Freq: Two times a day (BID) | ORAL | 1 refills | Status: DC

## 2020-08-28 ENCOUNTER — Ambulatory Visit: Payer: Medicare Other | Admitting: Physician Assistant

## 2020-09-10 ENCOUNTER — Ambulatory Visit: Payer: Medicare Other | Admitting: Physician Assistant

## 2020-09-23 ENCOUNTER — Other Ambulatory Visit: Payer: Self-pay | Admitting: Family Medicine

## 2020-09-23 DIAGNOSIS — M158 Other polyosteoarthritis: Secondary | ICD-10-CM

## 2020-10-02 ENCOUNTER — Ambulatory Visit: Payer: Medicare Other | Admitting: Physician Assistant

## 2020-10-07 ENCOUNTER — Ambulatory Visit: Payer: Medicare Other | Admitting: Physician Assistant

## 2020-10-16 ENCOUNTER — Ambulatory Visit (INDEPENDENT_AMBULATORY_CARE_PROVIDER_SITE_OTHER): Payer: Medicare Other | Admitting: Physician Assistant

## 2020-10-16 ENCOUNTER — Other Ambulatory Visit: Payer: Self-pay

## 2020-10-16 ENCOUNTER — Encounter: Payer: Self-pay | Admitting: Physician Assistant

## 2020-10-16 VITALS — BP 126/70 | HR 69 | Temp 97.2°F | Ht 75.0 in | Wt 227.0 lb

## 2020-10-16 DIAGNOSIS — R899 Unspecified abnormal finding in specimens from other organs, systems and tissues: Secondary | ICD-10-CM

## 2020-10-16 DIAGNOSIS — E782 Mixed hyperlipidemia: Secondary | ICD-10-CM

## 2020-10-16 DIAGNOSIS — M158 Other polyosteoarthritis: Secondary | ICD-10-CM

## 2020-10-16 DIAGNOSIS — R5381 Other malaise: Secondary | ICD-10-CM | POA: Diagnosis not present

## 2020-10-16 DIAGNOSIS — K219 Gastro-esophageal reflux disease without esophagitis: Secondary | ICD-10-CM

## 2020-10-16 DIAGNOSIS — F418 Other specified anxiety disorders: Secondary | ICD-10-CM | POA: Diagnosis not present

## 2020-10-16 MED ORDER — CELECOXIB 200 MG PO CAPS: 200.0000 mg | ORAL_CAPSULE | Freq: Every day | ORAL | 1 refills | Status: DC

## 2020-10-16 MED ORDER — FAMOTIDINE 40 MG PO TABS: 40.0000 mg | ORAL_TABLET | Freq: Every day | ORAL | 1 refills | Status: DC

## 2020-10-16 MED ORDER — BUSPIRONE HCL 5 MG PO TABS: 5.0000 mg | ORAL_TABLET | Freq: Two times a day (BID) | ORAL | 1 refills | Status: DC

## 2020-10-16 MED ORDER — ALPRAZOLAM 0.5 MG PO TABS
0.5000 mg | ORAL_TABLET | Freq: Every evening | ORAL | 2 refills | Status: DC | PRN
Start: 2020-10-16 — End: 2021-05-06

## 2020-10-16 MED ORDER — PAROXETINE HCL 20 MG PO TABS: 20.0000 mg | ORAL_TABLET | Freq: Every day | ORAL | 1 refills | Status: DC

## 2020-10-16 NOTE — Progress Notes (Signed)
Established Patient Office Visit  Subjective:  Patient ID: Aaron Meyer, male    DOB: Jan 27, 1971  Age: 50 y.o. MRN: [id number]  CC:  Chief Complaint  Patient presents with   Anxiety   Depression    HPI Adolphe Fortunato presents for follow up GERD   Follow up of gastro-esophageal reflux disease without esophagitis.  pt states he is doing well on pepcid  40mg  with no breakthrough symptoms -    Follow up of dysthymic disorder.  pt states he is doing well on his current meds - no breakthrough symptoms  --- he is having no side effects of the paxil 20mg  or buspar 5mg  denies mood swings, anxiety, crying spells, etc - he also uses xanax  0.5mg  as needed  Pt with history of osteoarthritis - currently on celebrex 200mg  qd Past Medical History:  Diagnosis Date   Anxiety    takes Xanax as needed   Anxiety    Chronic back pain    herniated disc,ddd,spondylosis and radiculopathy   Chronic back pain    Depression    takes Paxil daily   Depression with anxiety    GERD (gastroesophageal reflux disease)    takes Dexilant daily   GERD without esophagitis    Hemorrhoids    Panic disorder    Pneumothorax    couple of times but didn't require a chest tube;age 33 and 26   Pneumothorax     Past Surgical History:  Procedure Laterality Date   ESOPHAGOGASTRODUODENOSCOPY     HERNIA REPAIR     umbilical as a baby   LUMBAR LAMINECTOMY/DECOMPRESSION MICRODISCECTOMY  04/22/2011   Procedure: LUMBAR LAMINECTOMY/DECOMPRESSION MICRODISCECTOMY;  Surgeon: , MD;  Location: [facility];  Service: Neurosurgery;  Laterality: Left;  LEFT Lumbar five sacal one laminotomy and microdiskectomy   LUMBAR LAMINECTOMY/DECOMPRESSION MICRODISCECTOMY  05/05/2011   Procedure: LUMBAR LAMINECTOMY/DECOMPRESSION MICRODISCECTOMY;  Surgeon: , MD;  Location: [facility];  Service: Neurosurgery;  Laterality: N/A;  lumbar laminotomy and repair of Cerebrospinal Fluid Leak     Family History  Problem Relation Age of Onset   Anesthesia problems Neg Hx    Hypotension Neg Hx    Malignant hyperthermia Neg Hx    Pseudochol deficiency Neg Hx     Social History   Socioeconomic History   Marital status: Married    Spouse name: Not on file   Number of children: Not on file   Years of education: Not on file   Highest education level: Not on file  Occupational History   Not on file  Tobacco Use   Smoking status: Former   Smokeless tobacco: Never  Substance and Sexual Activity   Alcohol use: No   Drug use: No   Sexual activity: Yes  Other Topics Concern   Not on file  Social History Narrative   ** Merged History Encounter **       Social Determinants of Health   Financial Resource Strain: Not on file  Food Insecurity: Not on file  Transportation Needs: Not on file  Physical Activity: Not on file  Stress: Not on file  Social Connections: Not on file  Intimate Partner Violence: Not on file     Current Outpatient Medications:    ALPRAZolam (XANAX) 0.5 MG tablet, Take 1 tablet (0.5 mg total) by mouth at bedtime as needed. For sleep and anxiety, Disp: 30 tablet, Rfl: 2   busPIRone (BUSPAR) 5 MG tablet, Take 1 tablet (5 mg total)  by mouth 2 (two) times daily., Disp: 180 tablet, Rfl: 1   celecoxib (CELEBREX) 200 MG capsule, Take 1 capsule (200 mg total) by mouth daily., Disp: 30 capsule, Rfl: 5   famotidine (PEPCID) 40 MG tablet, Take 1 tablet (40 mg total) by mouth daily., Disp: 90 tablet, Rfl: 1   PARoxetine (PAXIL) 20 MG tablet, Take 1 tablet by mouth once daily, Disp: 90 tablet, Rfl: 0 No current facility-administered medications for this visit.  Facility-Administered Medications Ordered in Other Visits:    pantoprazole (PROTONIX) EC tablet 40 mg, 40 mg, Oral, Q1200, Hilda Lias, MD   PARoxetine (PAXIL) tablet 20 mg, 20 mg, Oral, QPM, Hilda Lias, MD   Allergies  Allergen Reactions   Biaxin [Clarithromycin] Nausea And Vomiting    Clarithromycin Nausea And Vomiting   Eggs Or Egg-Derived Products Hives   Shrimp [Shellfish Allergy] Hives   Other Other (See Comments) and Rash    Eggs and shrimp (hives) Scratchy Throat    ROS CONSTITUTIONAL: Negative for chills, fatigue, fever, unintentional weight gain and unintentional weight loss.  E/N/T: Negative for ear pain, nasal congestion and sore throat.  CARDIOVASCULAR: Negative for chest pain, dizziness, palpitations and pedal edema.  RESPIRATORY: Negative for recent cough and dyspnea.  GASTROINTESTINAL: Negative for abdominal pain, acid reflux symptoms, constipation, diarrhea, nausea and vomiting.  MSK: see HPI PSYCHIATRIC:see HPI       Objective:    PHYSICAL EXAM:   VS: BP 126/70   Pulse 69   Temp (!) 97.2 F (36.2 C)   Ht 6\' 3"  (1.905 m)   Wt 227 lb (103 kg)   SpO2 97%   BMI 28.37 kg/m   GEN: Well nourished, well developed, in no acute distress  Cardiac: RRR; no murmurs, rubs, or gallops,no edema -  Respiratory:  normal respiratory rate and pattern with no distress - normal breath sounds with no rales, rhonchi, wheezes or rubs GI: normal bowel sounds, no masses or tenderness MS: no deformity or atrophy  Skin: warm and dry, no rash  Psych: euthymic mood, appropriate affect and demeanor  BP 126/70   Pulse 69   Temp (!) 97.2 F (36.2 C)   Ht 6\' 3"  (1.905 m)   Wt 227 lb (103 kg)   SpO2 97%   BMI 28.37 kg/m  Wt Readings from Last 3 Encounters:  10/16/20 227 lb (103 kg)  01/02/20 223 lb (101.2 kg)  07/27/19 228 lb (103.4 kg)     Health Maintenance Due  Topic Date Due   Pneumococcal Vaccine 71-39 Years old (1 - PCV) Never done   TETANUS/TDAP  Never done   Zoster Vaccines- Shingrix (1 of 2) Never done   COVID-19 Vaccine (3 - Moderna risk series) 09/11/2019    There are no preventive care reminders to display for this patient.  No results found for: TSH Lab Results  Component Value Date   WBC 7.4 05/16/2015   HGB 16.0 05/16/2015   HCT  46.0 05/16/2015   MCV 79.9 05/16/2015   PLT 170 05/16/2015   Lab Results  Component Value Date   NA 138 05/16/2015   K 3.6 05/16/2015   CO2 21 (L) 05/16/2015   GLUCOSE 124 (H) 05/16/2015   BUN 15 05/16/2015   CREATININE 1.22 05/16/2015   BILITOT 1.2 05/16/2015   ALKPHOS 63 05/16/2015   AST 45 (H) 05/16/2015   ALT 53 05/16/2015   PROT 6.8 05/16/2015   ALBUMIN 3.9 05/16/2015   CALCIUM 9.7 05/16/2015   ANIONGAP 12  05/16/2015   No results found for: CHOL No results found for: HDL No results found for: LDLCALC No results found for: TRIG No results found for: CHOLHDL No results found for: CBJS2G    Assessment & Plan:   Problem List Items Addressed This Visit       Digestive   GERD without esophagitis Continue current meds     Other   Depression with anxiety Continue current meds   Malaise - Primary   Relevant Orders   CBC with Differential/Platelet   Comprehensive metabolic panel   TSH   Abnormal laboratory test - pt with history of elevated liver enzymes in past   Relevant Orders   CBC with Differential/Platelet   Comprehensive metabolic panel   TSH   Other Visit Diagnoses     Mixed hyperlipidemia       Relevant Orders   Lipid panel       No orders of the defined types were placed in this encounter.   Follow-up: Return in about 6 months (around 04/18/2021) for chronic fasting follow up.   Pt defers labwork today SARA R Nehal Shives, PA-C

## 2020-10-17 LAB — CBC WITH DIFFERENTIAL/PLATELET
Basophils Absolute: 0.1 10*3/uL (ref 0.0–0.2)
Basos: 1 %
EOS (ABSOLUTE): 0.2 10*3/uL (ref 0.0–0.4)
Eos: 4 %
Hematocrit: 49.3 % (ref 37.5–51.0)
Hemoglobin: 16 g/dL (ref 13.0–17.7)
Immature Grans (Abs): 0 10*3/uL (ref 0.0–0.1)
Immature Granulocytes: 0 %
Lymphocytes Absolute: 1.8 10*3/uL (ref 0.7–3.1)
Lymphs: 42 %
MCH: 27.4 pg (ref 26.6–33.0)
MCHC: 32.5 g/dL (ref 31.5–35.7)
MCV: 85 fL (ref 79–97)
Monocytes Absolute: 0.3 10*3/uL (ref 0.1–0.9)
Monocytes: 7 %
Neutrophils Absolute: 2 10*3/uL (ref 1.4–7.0)
Neutrophils: 46 %
Platelets: 174 10*3/uL (ref 150–450)
RBC: 5.83 x10E6/uL — ABNORMAL HIGH (ref 4.14–5.80)
RDW: 13.2 % (ref 11.6–15.4)
WBC: 4.3 10*3/uL (ref 3.4–10.8)

## 2020-10-17 LAB — LIPID PANEL
Chol/HDL Ratio: 4.9 ratio (ref 0.0–5.0)
Cholesterol, Total: 166 mg/dL (ref 100–199)
HDL: 34 mg/dL — ABNORMAL LOW (ref >39)
LDL Chol Calc (NIH): 106 mg/dL — ABNORMAL HIGH (ref 0–99)
Triglycerides: 143 mg/dL (ref 0–149)
VLDL Cholesterol Cal: 26 mg/dL (ref 5–40)

## 2020-10-17 LAB — COMPREHENSIVE METABOLIC PANEL
ALT: 39 IU/L (ref 0–44)
AST: 29 IU/L (ref 0–40)
Albumin/Globulin Ratio: 2.2 (ref 1.2–2.2)
Albumin: 4.4 g/dL (ref 3.8–4.9)
Alkaline Phosphatase: 65 IU/L (ref 44–121)
BUN/Creatinine Ratio: 16 (ref 9–20)
BUN: 20 mg/dL (ref 6–24)
Bilirubin Total: 1 mg/dL (ref 0.0–1.2)
CO2: 24 mmol/L (ref 20–29)
Calcium: 9.3 mg/dL (ref 8.7–10.2)
Chloride: 104 mmol/L (ref 96–106)
Creatinine, Ser: 1.23 mg/dL (ref 0.76–1.27)
Globulin, Total: 2 g/dL (ref 1.5–4.5)
Glucose: 93 mg/dL (ref 65–99)
Potassium: 4.6 mmol/L (ref 3.5–5.2)
Sodium: 139 mmol/L (ref 134–144)
Total Protein: 6.4 g/dL (ref 6.0–8.5)
eGFR: 71 mL/min/{1.73_m2} (ref >59)

## 2020-10-17 LAB — TSH: TSH: 2.13 u[IU]/mL (ref 0.450–4.500)

## 2020-10-17 LAB — CARDIOVASCULAR RISK ASSESSMENT

## 2020-12-24 ENCOUNTER — Ambulatory Visit (INDEPENDENT_AMBULATORY_CARE_PROVIDER_SITE_OTHER): Payer: Medicare Other

## 2020-12-24 VITALS — Ht 75.0 in | Wt 225.0 lb

## 2020-12-24 DIAGNOSIS — Z0000 Encounter for general adult medical examination without abnormal findings: Secondary | ICD-10-CM | POA: Diagnosis not present

## 2020-12-24 NOTE — Progress Notes (Signed)
Subjective:   Aaron Meyer is a 50 y.o. male who presents for Medicare Annual/Subsequent preventive examination.  I connected with  Arvilla Market on 12/24/20 by a Audio enabled telemedicine application and verified that I am speaking with the correct person using two identifiers.   I discussed the limitations of evaluation and management by telemedicine. The patient expressed understanding and agreed to proceed.   Location of Patient: Home Location of Provider: Home Persons participating in visit: Aaron Meyer (patient) and Gwynneth Aliment, CMA  Review of Systems    Defer to PCP  Patient will discuss vaccine ( shingrix, Tdap, and Covid booster) with Provider at next visit and also wants to talk about colonoscopy with provider.  Cardiac Risk Factors include: advanced age (>14men, >1 women)     Objective:    Today's Vitals   12/24/20 1455 12/24/20 1456  Weight: 225 lb (102.1 kg)   Height: 6\' 3"  (1.905 m)   PainSc:  7    Body mass index is 28.12 kg/m.  Advanced Directives 12/24/2020 05/17/2015 05/16/2015 05/05/2011 04/27/2011 04/20/2011  Does Patient Have a Medical Advance Directive? Yes No No Patient does not have advance directive Patient does not have advance directive Patient does not have advance directive;Patient would not like information  Does patient want to make changes to medical advance directive? No - Patient declined - - - - -  Would patient like information on creating a medical advance directive? - No - patient declined information - - - -  Pre-existing out of facility DNR order (yellow form or pink MOST form) - - - No No -  Some encounter information is confidential and restricted. Go to Review Flowsheets activity to see all data.    Current Medications (verified) Outpatient Encounter Medications as of 12/24/2020  Medication Sig   ALPRAZolam (XANAX) 0.5 MG tablet Take 1 tablet (0.5 mg total) by mouth at bedtime as needed. For sleep and anxiety    busPIRone (BUSPAR) 5 MG tablet Take 1 tablet (5 mg total) by mouth 2 (two) times daily.   celecoxib (CELEBREX) 200 MG capsule Take 1 capsule (200 mg total) by mouth daily.   famotidine (PEPCID) 40 MG tablet Take 1 tablet (40 mg total) by mouth daily.   PARoxetine (PAXIL) 20 MG tablet Take 1 tablet (20 mg total) by mouth daily.   Facility-Administered Encounter Medications as of 12/24/2020  Medication   pantoprazole (PROTONIX) EC tablet 40 mg   PARoxetine (PAXIL) tablet 20 mg    Allergies (verified) Biaxin [clarithromycin], Clarithromycin, Eggs or egg-derived products, Shrimp [shellfish allergy], and Other   History: Past Medical History:  Diagnosis Date   Anxiety    takes Xanax as needed   Anxiety    Chronic back pain    herniated disc,ddd,spondylosis and radiculopathy   Chronic back pain    Depression    takes Paxil daily   Depression with anxiety    GERD (gastroesophageal reflux disease)    takes Dexilant daily   GERD without esophagitis    Hemorrhoids    Panic disorder    Pneumothorax    couple of times but didn't require a chest tube;age 89 and 26   Pneumothorax    Past Surgical History:  Procedure Laterality Date   ESOPHAGOGASTRODUODENOSCOPY     HERNIA REPAIR     umbilical as a baby   LUMBAR LAMINECTOMY/DECOMPRESSION MICRODISCECTOMY  04/22/2011   Procedure: LUMBAR LAMINECTOMY/DECOMPRESSION MICRODISCECTOMY;  Surgeon: 04/24/2011, MD;  Location: [facility];  Service: Neurosurgery;  Laterality: Left;  LEFT Lumbar five sacal one laminotomy and microdiskectomy   LUMBAR LAMINECTOMY/DECOMPRESSION MICRODISCECTOMY  05/05/2011   Procedure: LUMBAR LAMINECTOMY/DECOMPRESSION MICRODISCECTOMY;  Surgeon: Hewitt Shorts, MD;  Location: [facility];  Service: Neurosurgery;  Laterality: N/A;  lumbar laminotomy and repair of Cerebrospinal Fluid Leak   Family History  Problem Relation Age of Onset   Anesthesia problems Neg Hx    Hypotension Neg Hx    Malignant hyperthermia  Neg Hx    Pseudochol deficiency Neg Hx    Social History   Socioeconomic History   Marital status: Married    Spouse name: Not on file   Number of children: Not on file   Years of education: Not on file   Highest education level: Not on file  Occupational History   Not on file  Tobacco Use   Smoking status: Former   Smokeless tobacco: Never  Substance and Sexual Activity   Alcohol use: No   Drug use: No   Sexual activity: Yes  Other Topics Concern   Not on file  Social History Narrative   ** Merged History Encounter **       Social Determinants of Health   Financial Resource Strain: Low Risk    Difficulty of Paying Living Expenses: Not hard at all  Food Insecurity: No Food Insecurity   Worried About Programme researcher, broadcasting/film/video in the Last Year: Never true   Ran Out of Food in the Last Year: Never true  Transportation Needs: No Transportation Needs   Lack of Transportation (Medical): No   Lack of Transportation (Non-Medical): No  Physical Activity: Sufficiently Active   Days of Exercise per Week: 7 days   Minutes of Exercise per Session: 60 min  Stress: No Stress Concern Present   Feeling of Stress : Only a little  Social Connections: Moderately Isolated   Frequency of Communication with Friends and Family: Three times a week   Frequency of Social Gatherings with Friends and Family: Once a week   Attends Religious Services: Never   Database administrator or Organizations: No   Attends Engineer, structural: Never   Marital Status: Married    Tobacco Counseling Counseling given: Not Answered   Clinical Intake:  Pre-visit preparation completed: Yes  Pain : 0-10 Pain Score: 7  Pain Type: Chronic pain Pain Location: Back Pain Orientation: Lower Pain Descriptors / Indicators: Aching, Constant, Burning Pain Onset: Today Pain Frequency: Constant Pain Relieving Factors: resting and using heating pad. Effect of Pain on Daily Activities: Moving around to  much  Pain Relieving Factors: resting and using heating pad.  BMI - recorded: 28.12 Nutritional Status: BMI 25 -29 Overweight Nutritional Risks: None Diabetes: No  How often do you need to have someone help you when you read instructions, pamphlets, or other written materials from your doctor or pharmacy?: 1 - Never What is the last grade level you completed in school?: Some college  Diabetic?No  Interpreter Needed?: No  Information entered by :: Gwynneth Aliment   Activities of Daily Living In your present state of health, do you have any difficulty performing the following activities: 12/24/2020  Hearing? N  Vision? N  Difficulty concentrating or making decisions? N  Walking or climbing stairs? Y  Comment walks with cane  Dressing or bathing? N  Doing errands, shopping? N  Preparing Food and eating ? N  Using the Toilet? N  In the past six months, have you accidently leaked urine?  N  Do you have problems with loss of bowel control? N  Managing your Medications? N  Managing your Finances? N  Housekeeping or managing your Housekeeping? N  Some recent data might be hidden    Patient Care Team: Marianne Sofia, Cordelia Poche as PCP - General (Physician Assistant)  Indicate any recent Medical Services you may have received from other than [facility] providers in the past year (date may be approximate).     Assessment:   This is a routine wellness examination for Aaron Meyer.  Hearing/Vision screen No results found.  Dietary issues and exercise activities discussed: Current Exercise Habits: Home exercise routine, Type of exercise: walking, Time (Minutes): 60, Frequency (Times/Week): 7, Weekly Exercise (Minutes/Week): 420, Intensity: Mild   Goals Addressed   None   Depression Screen PHQ 2/9 Scores 12/24/2020 10/16/2020  PHQ - 2 Score 0 0  PHQ- 9 Score - 1    Fall Risk Fall Risk  12/24/2020 10/16/2020  Falls in the past year? 0 1  Number falls in past yr: 0 0  Injury with Fall? 0 0  Risk  for fall due to : History of fall(s);Impaired balance/gait Impaired balance/gait  Follow up Falls prevention discussed Falls evaluation completed    FALL RISK PREVENTION PERTAINING TO THE HOME:  Any stairs in or around the home? Yes  If so, are there any without handrails? No  Home free of loose throw rugs in walkways, pet beds, electrical cords, etc? Yes  Adequate lighting in your home to reduce risk of falls? Yes   ASSISTIVE DEVICES UTILIZED TO PREVENT FALLS:  Life alert? No  Use of a cane, walker or w/c? Yes  Grab bars in the bathroom? No  Shower chair or bench in shower? Yes  Elevated toilet seat or a handicapped toilet? Yes   TIMED UP AND GO:  Was the test performed?  N/A .  Length of time to ambulate 10 feet: N/A sec.     Cognitive Function:     6CIT Screen 12/24/2020  What Year? 0 points  What month? 0 points  What time? 0 points  Count back from 20 0 points  Months in reverse 0 points  Repeat phrase 0 points  Total Score 0    Immunizations Immunization History  Administered Date(s) Administered   Moderna Sars-Covid-2 Vaccination 07/17/2019, 08/14/2019    TDAP status: Due, Education has been provided regarding the importance of this vaccine. Advised may receive this vaccine at local pharmacy or Health Dept. Aware to provide a copy of the vaccination record if obtained from local pharmacy or Health Dept. Verbalized acceptance and understanding.  Flu Vaccine status: Declined, Education has been provided regarding the importance of this vaccine but patient still declined. Advised may receive this vaccine at local pharmacy or Health Dept. Aware to provide a copy of the vaccination record if obtained from local pharmacy or Health Dept. Verbalized acceptance and understanding.  Pneumococcal vaccine status: Declined,  Education has been provided regarding the importance of this vaccine but patient still declined. Advised may receive this vaccine at local pharmacy or  Health Dept. Aware to provide a copy of the vaccination record if obtained from local pharmacy or Health Dept. Verbalized acceptance and understanding.   Covid-19 vaccine status: Completed vaccines  Qualifies for Shingles Vaccine? No   Zostavax completed No   Shingrix Completed?: No.    Education has been provided regarding the importance of this vaccine. Patient has been advised to call insurance company to determine out of pocket  expense if they have not yet received this vaccine. Advised may also receive vaccine at local pharmacy or Health Dept. Verbalized acceptance and understanding.  Screening Tests Health Maintenance  Topic Date Due   TETANUS/TDAP  Never done   Zoster Vaccines- Shingrix (1 of 2) Never done   COVID-19 Vaccine (3 - Moderna risk series) 09/11/2019   COLONOSCOPY (Pts 45-26yrs Insurance coverage will need to be confirmed)  01/01/2021 (Originally 09/05/2013)   HPV VACCINES  Aged Out   INFLUENZA VACCINE  Discontinued   Hepatitis C Screening  Discontinued   HIV Screening  Discontinued    Health Maintenance  Health Maintenance Due  Topic Date Due   TETANUS/TDAP  Never done   Zoster Vaccines- Shingrix (1 of 2) Never done   COVID-19 Vaccine (3 - Moderna risk series) 09/11/2019    Colorectal cancer screening: Referral to GI placed patient will speak to provider at next visit.Marland Kitchen Pt aware the office will call re: appt.  Lung Cancer Screening: (Low Dose CT Chest recommended if Age 19-80 years, 30 pack-year currently smoking OR have quit w/in 15years.) does not qualify.   Lung Cancer Screening Referral: N/A  Additional Screening:  Hepatitis C Screening: does qualify; Completed 04/07/2017  Vision Screening: Recommended annual ophthalmology exams for early detection of glaucoma and other disorders of the eye. Is the patient up to date with their annual eye exam?  Yes  Who is the provider or what is the name of the office in which the patient attends annual eye exams? [facility] If pt is not established with a provider, would they like to be referred to a provider to establish care? No .   Dental Screening: Recommended annual dental exams for proper oral hygiene  Community Resource Referral / Chronic Care Management: CRR required this visit?  No   CCM required this visit?  No      Plan:     I have personally reviewed and noted the following in the patient's chart:   Medical and social history Use of alcohol, tobacco or illicit drugs  Current medications and supplements including opioid prescriptions. Patient is not currently taking opioid prescriptions. Functional ability and status Nutritional status Physical activity Advanced directives List of other physicians Hospitalizations, surgeries, and ER visits in previous 12 months Vitals Screenings to include cognitive, depression, and falls Referrals and appointments  In addition, I have reviewed and discussed with patient certain preventive protocols, quality metrics, and best practice recommendations. A written personalized care plan for preventive services as well as general preventive health recommendations were provided to patient.     Jomarie Longs, CMA   12/24/2020   Nurse Notes:   Non-Face to face 30 minute visit encounter.   Aaron Meyer , Thank you for taking time to come for your Medicare Wellness Visit. I appreciate your ongoing commitment to your health goals. Please review the following plan we discussed and let me know if I can assist you in the future.   These are the goals we discussed:  Goals   None     This is a list of the screening recommended for you and due dates:  Health Maintenance  Topic Date Due   Tetanus Vaccine  Never done   Zoster (Shingles) Vaccine (1 of 2) Never done   COVID-19 Vaccine (3 - Moderna risk series) 09/11/2019   Colon Cancer Screening  01/01/2021*   HPV Vaccine  Aged Out   Flu Shot  Discontinued   Hepatitis C  Screening: USPSTF  Recommendation to screen - Ages 2-79 yo.  Discontinued   HIV Screening  Discontinued  *Topic was postponed. The date shown is not the original due date.

## 2021-04-13 ENCOUNTER — Other Ambulatory Visit: Payer: Self-pay | Admitting: Physician Assistant

## 2021-04-13 DIAGNOSIS — K219 Gastro-esophageal reflux disease without esophagitis: Secondary | ICD-10-CM

## 2021-04-13 DIAGNOSIS — M158 Other polyosteoarthritis: Secondary | ICD-10-CM

## 2021-04-17 ENCOUNTER — Ambulatory Visit: Payer: Medicare Other | Admitting: Physician Assistant

## 2021-05-01 ENCOUNTER — Other Ambulatory Visit: Payer: Self-pay | Admitting: Physician Assistant

## 2021-05-01 DIAGNOSIS — K219 Gastro-esophageal reflux disease without esophagitis: Secondary | ICD-10-CM

## 2021-05-01 DIAGNOSIS — M158 Other polyosteoarthritis: Secondary | ICD-10-CM

## 2021-05-01 MED ORDER — CELECOXIB 200 MG PO CAPS: 200.0000 mg | ORAL_CAPSULE | Freq: Every day | ORAL | 0 refills | Status: DC

## 2021-05-01 MED ORDER — FAMOTIDINE 40 MG PO TABS: 40.0000 mg | ORAL_TABLET | Freq: Every day | ORAL | 0 refills | Status: DC

## 2021-05-03 ENCOUNTER — Other Ambulatory Visit: Payer: Self-pay | Admitting: Family Medicine

## 2021-05-03 DIAGNOSIS — F418 Other specified anxiety disorders: Secondary | ICD-10-CM

## 2021-05-06 ENCOUNTER — Ambulatory Visit (INDEPENDENT_AMBULATORY_CARE_PROVIDER_SITE_OTHER): Payer: Medicare Other | Admitting: Physician Assistant

## 2021-05-06 ENCOUNTER — Other Ambulatory Visit: Payer: Self-pay

## 2021-05-06 ENCOUNTER — Encounter: Payer: Self-pay | Admitting: Physician Assistant

## 2021-05-06 VITALS — BP 124/84 | HR 66 | Temp 97.6°F | Ht 75.0 in | Wt 235.0 lb

## 2021-05-06 DIAGNOSIS — M158 Other polyosteoarthritis: Secondary | ICD-10-CM | POA: Diagnosis not present

## 2021-05-06 DIAGNOSIS — Z1211 Encounter for screening for malignant neoplasm of colon: Secondary | ICD-10-CM

## 2021-05-06 DIAGNOSIS — E782 Mixed hyperlipidemia: Secondary | ICD-10-CM

## 2021-05-06 DIAGNOSIS — K219 Gastro-esophageal reflux disease without esophagitis: Secondary | ICD-10-CM | POA: Diagnosis not present

## 2021-05-06 DIAGNOSIS — F418 Other specified anxiety disorders: Secondary | ICD-10-CM | POA: Diagnosis not present

## 2021-05-06 MED ORDER — PAROXETINE HCL 20 MG PO TABS: 20.0000 mg | ORAL_TABLET | Freq: Every day | ORAL | 1 refills | Status: DC

## 2021-05-06 MED ORDER — ALPRAZOLAM 0.5 MG PO TABS: 0.5000 mg | ORAL_TABLET | Freq: Every evening | ORAL | 2 refills | Status: AC | PRN

## 2021-05-06 MED ORDER — BUSPIRONE HCL 5 MG PO TABS: 5.0000 mg | ORAL_TABLET | Freq: Two times a day (BID) | ORAL | 1 refills | Status: DC

## 2021-05-06 MED ORDER — FAMOTIDINE 40 MG PO TABS: 40.0000 mg | ORAL_TABLET | Freq: Every day | ORAL | 1 refills | Status: AC

## 2021-05-06 MED ORDER — CELECOXIB 200 MG PO CAPS: 200.0000 mg | ORAL_CAPSULE | Freq: Every day | ORAL | 1 refills | Status: AC

## 2021-05-06 NOTE — Progress Notes (Signed)
Established Patient Office Visit  Subjective:  Patient ID: Aaron Meyer, male    DOB: [birth year]  Age: 51 y.o. MRN: [id number]  CC:  Chief Complaint  Patient presents with   Gastroesophageal Reflux   Anxiety    HPI Aaron Meyer presents for follow up GERD   Follow up of gastro-esophageal reflux disease without esophagitis.  pt states he is doing well on pepcid with no breakthrough symptoms -    Follow up of dysthymic disorder.  pt states he is doing well on his current meds - no breakthrough symptoms  --- he is having no side effects of the paxil or buspar denies mood swings, anxiety, crying spells, etc - he also uses xanax as needed  Pt complains of osteoarthritis - has had chronic trouble with back and hands - he is using celebrex which works well for him  Pt would like to schedule for screening colonoscopy Past Medical History:  Diagnosis Date   Anxiety    takes Xanax as needed   Anxiety    Chronic back pain    herniated disc,ddd,spondylosis and radiculopathy   Chronic back pain    Depression    takes Paxil daily   Depression with anxiety    GERD (gastroesophageal reflux disease)    takes Dexilant daily   GERD without esophagitis    Hemorrhoids    Panic disorder    Pneumothorax    couple of times but didn't require a chest tube;age 71 and 26   Pneumothorax     Past Surgical History:  Procedure Laterality Date   ESOPHAGOGASTRODUODENOSCOPY     HERNIA REPAIR     umbilical as a baby   LUMBAR LAMINECTOMY/DECOMPRESSION MICRODISCECTOMY  04/22/2011   Procedure: LUMBAR LAMINECTOMY/DECOMPRESSION MICRODISCECTOMY;  Surgeon: Hosie Spangle, MD;  Location: [place] [facility];  Service: Neurosurgery;  Laterality: Left;  LEFT Lumbar five sacal one laminotomy and microdiskectomy   LUMBAR LAMINECTOMY/DECOMPRESSION MICRODISCECTOMY  05/05/2011   Procedure: LUMBAR LAMINECTOMY/DECOMPRESSION MICRODISCECTOMY;  Surgeon: Hosie Spangle, MD;  Location: [place] [facility];   Service: Neurosurgery;  Laterality: N/A;  lumbar laminotomy and repair of Cerebrospinal Fluid Leak    Family History  Problem Relation Age of Onset   Anesthesia problems Neg Hx    Hypotension Neg Hx    Malignant hyperthermia Neg Hx    Pseudochol deficiency Neg Hx     Social History   Socioeconomic History   Marital status: Married    Spouse name: Not on file   Number of children: Not on file   Years of education: Not on file   Highest education level: Not on file  Occupational History   Not on file  Tobacco Use   Smoking status: Former   Smokeless tobacco: Never  Substance and Sexual Activity   Alcohol use: No   Drug use: No   Sexual activity: Yes  Other Topics Concern   Not on file  Social History Narrative   ** Merged History Encounter **       Social Determinants of Health   Financial Resource Strain: Low Risk    Difficulty of Paying Living Expenses: Not hard at all  Food Insecurity: No Food Insecurity   Worried About Charity fundraiser in the Last Year: Never true   Ran Out of Food in the Last Year: Never true  Transportation Needs: No Transportation Needs   Lack of Transportation (Medical): No   Lack of Transportation (Non-Medical): No  Physical Activity: Sufficiently Active  Days of Exercise per Week: 7 days   Minutes of Exercise per Session: 60 min  Stress: No Stress Concern Present   Feeling of Stress : Only a little  Social Connections: Moderately Isolated   Frequency of Communication with Friends and Family: Three times a week   Frequency of Social Gatherings with Friends and Family: Once a week   Attends Religious Services: Never   Marine scientist or Organizations: No   Attends Music therapist: Never   Marital Status: Married  Human resources officer Violence: Not At Risk   Fear of Current or Ex-Partner: No   Emotionally Abused: No   Physically Abused: No   Sexually Abused: No     Current Outpatient Medications:    ALPRAZolam  (XANAX) 0.5 MG tablet, Take 1 tablet (0.5 mg total) by mouth at bedtime as needed. For sleep and anxiety, Disp: 30 tablet, Rfl: 2   busPIRone (BUSPAR) 5 MG tablet, Take 1 tablet (5 mg total) by mouth 2 (two) times daily., Disp: 180 tablet, Rfl: 1   celecoxib (CELEBREX) 200 MG capsule, Take 1 capsule (200 mg total) by mouth daily., Disp: 90 capsule, Rfl: 1   famotidine (PEPCID) 40 MG tablet, Take 1 tablet (40 mg total) by mouth daily., Disp: 90 tablet, Rfl: 1   PARoxetine (PAXIL) 20 MG tablet, Take 1 tablet (20 mg total) by mouth daily., Disp: 90 tablet, Rfl: 1 No current facility-administered medications for this visit.  Facility-Administered Medications Ordered in Other Visits:    pantoprazole (PROTONIX) EC tablet 40 mg, 40 mg, Oral, Q1200, Leeroy Cha, MD   PARoxetine (PAXIL) tablet 20 mg, 20 mg, Oral, QPM, Leeroy Cha, MD   Allergies  Allergen Reactions   Biaxin [Clarithromycin] Nausea And Vomiting   Clarithromycin Nausea And Vomiting   Eggs Or Egg-Derived Products Hives   Shrimp [Shellfish Allergy] Hives   Other Other (See Comments) and Rash    Eggs and shrimp (hives) Scratchy Throat   CONSTITUTIONAL: Negative for chills, fatigue, fever, unintentional weight gain and unintentional weight loss.  E/N/T: Negative for ear pain, nasal congestion and sore throat.  CARDIOVASCULAR: Negative for chest pain, dizziness, palpitations and pedal edema.  RESPIRATORY: Negative for recent cough and dyspnea.  GASTROINTESTINAL: Negative for abdominal pain, acid reflux symptoms, constipation, diarrhea, nausea and vomiting.  MSK: Negative for arthralgias and myalgias.  INTEGUMENTARY: Negative for rash.  NEUROLOGICAL: Negative for dizziness and headaches.  PSYCHIATRIC: Negative for sleep disturbance and to question depression screen.  Negative for depression, negative for anhedonia.         Objective:    PHYSICAL EXAM:   VS: BP 124/84 (BP Location: Left Arm, Patient Position: Sitting)     Pulse 66    Temp 97.6 F (36.4 C) (Oral)    Ht 6' 3"  (1.905 m)    Wt 235 lb (106.6 kg)    SpO2 96%    BMI 29.37 kg/m   GEN: Well nourished, well developed, in no acute distress  Cardiac: RRR; no murmurs, rubs, or gallops,no edema - Respiratory:  normal respiratory rate and pattern with no distress - normal breath sounds with no rales, rhonchi, wheezes or rubs Skin: warm and dry, no rash  Psych: euthymic mood, appropriate affect and demeanor   Health Maintenance Due  Topic Date Due   COLONOSCOPY (Pts 45-59yr Insurance coverage will need to be confirmed)  Never done    There are no preventive care reminders to display for this patient.  Lab Results  Component  Value Date   TSH 2.130 10/16/2020   Lab Results  Component Value Date   WBC 4.3 10/16/2020   HGB 16.0 10/16/2020   HCT 49.3 10/16/2020   MCV 85 10/16/2020   PLT 174 10/16/2020   Lab Results  Component Value Date   NA 139 10/16/2020   K 4.6 10/16/2020   CO2 24 10/16/2020   GLUCOSE 93 10/16/2020   BUN 20 10/16/2020   CREATININE 1.23 10/16/2020   BILITOT 1.0 10/16/2020   ALKPHOS 65 10/16/2020   AST 29 10/16/2020   ALT 39 10/16/2020   PROT 6.4 10/16/2020   ALBUMIN 4.4 10/16/2020   CALCIUM 9.3 10/16/2020   ANIONGAP 12 05/16/2015   EGFR 71 10/16/2020   Lab Results  Component Value Date   CHOL 166 10/16/2020   Lab Results  Component Value Date   HDL 34 (L) 10/16/2020   Lab Results  Component Value Date   LDLCALC 106 (H) 10/16/2020   Lab Results  Component Value Date   TRIG 143 10/16/2020   Lab Results  Component Value Date   CHOLHDL 4.9 10/16/2020   No results found for: HGBA1C    Assessment & Plan:   Problem List Items Addressed This Visit       Digestive   GERD without esophagitis - Primary   Relevant Medications   famotidine (PEPCID) 40 MG tablet     Musculoskeletal and Integument   Degenerative joint disease involving multiple joints   Relevant Medications   celecoxib (CELEBREX) 200  MG capsule     Other   Depression with anxiety   Relevant Medications   ALPRAZolam (XANAX) 0.5 MG tablet   busPIRone (BUSPAR) 5 MG tablet   PARoxetine (PAXIL) 20 MG tablet   Other Relevant Orders   TSH   Other Visit Diagnoses     Mixed hyperlipidemia       Relevant Orders   CBC with Differential/Platelet   Comprehensive metabolic panel   TSH   Lipid panel      Colon cancer screening       Relevant Orders   Ambulatory referral to Gastroenterology   INSURANCE DOES NOT COVER PSA - COST $65 TO PATIENT PER WAIVER - PT DEFERS AT THIS TIME    Meds ordered this encounter  Medications   ALPRAZolam (XANAX) 0.5 MG tablet    Sig: Take 1 tablet (0.5 mg total) by mouth at bedtime as needed. For sleep and anxiety    Dispense:  30 tablet    Refill:  2    Order Specific Question:   Supervising Provider    Answer:   COX, Lynder Parents   busPIRone (BUSPAR) 5 MG tablet    Sig: Take 1 tablet (5 mg total) by mouth 2 (two) times daily.    Dispense:  180 tablet    Refill:  1    Order Specific Question:   Supervising Provider    Answer:   Shelton Silvas   celecoxib (CELEBREX) 200 MG capsule    Sig: Take 1 capsule (200 mg total) by mouth daily.    Dispense:  90 capsule    Refill:  1    Order Specific Question:   Supervising Provider    Answer:   Shelton Silvas   famotidine (PEPCID) 40 MG tablet    Sig: Take 1 tablet (40 mg total) by mouth daily.    Dispense:  90 tablet    Refill:  1    Order Specific Question:  Supervising Provider    Answer:   Rochel Brome [[id number]]   PARoxetine (PAXIL) 20 MG tablet    Sig: Take 1 tablet (20 mg total) by mouth daily.    Dispense:  90 tablet    Refill:  1    Order Specific Question:   Supervising Provider    Answer:   Shelton Silvas    Follow-up: Return in about 6 months (around 11/03/2021) for chronic fasting follow up.   Pt defers labwork today SARA R Edwin Baines, PA-C

## 2021-05-07 LAB — CBC WITH DIFFERENTIAL/PLATELET
Basophils Absolute: 0 10*3/uL (ref 0.0–0.2)
Basos: 1 %
EOS (ABSOLUTE): 0.1 10*3/uL (ref 0.0–0.4)
Eos: 2 %
Hematocrit: 47.6 % (ref 37.5–51.0)
Hemoglobin: 15.7 g/dL (ref 13.0–17.7)
Immature Grans (Abs): 0 10*3/uL (ref 0.0–0.1)
Immature Granulocytes: 0 %
Lymphocytes Absolute: 1.5 10*3/uL (ref 0.7–3.1)
Lymphs: 38 %
MCH: 27.7 pg (ref 26.6–33.0)
MCHC: 33 g/dL (ref 31.5–35.7)
MCV: 84 fL (ref 79–97)
Monocytes Absolute: 0.4 10*3/uL (ref 0.1–0.9)
Monocytes: 10 %
Neutrophils Absolute: 2 10*3/uL (ref 1.4–7.0)
Neutrophils: 49 %
Platelets: 179 10*3/uL (ref 150–450)
RBC: 5.67 x10E6/uL (ref 4.14–5.80)
RDW: 13.1 % (ref 11.6–15.4)
WBC: 4 10*3/uL (ref 3.4–10.8)

## 2021-05-07 LAB — COMPREHENSIVE METABOLIC PANEL
ALT: 46 IU/L — ABNORMAL HIGH (ref 0–44)
AST: 33 IU/L (ref 0–40)
Albumin/Globulin Ratio: 2 (ref 1.2–2.2)
Albumin: 4.5 g/dL (ref 3.8–4.9)
Alkaline Phosphatase: 71 IU/L (ref 44–121)
BUN/Creatinine Ratio: 18 (ref 9–20)
BUN: 20 mg/dL (ref 6–24)
Bilirubin Total: 0.8 mg/dL (ref 0.0–1.2)
CO2: 24 mmol/L (ref 20–29)
Calcium: 8.9 mg/dL (ref 8.7–10.2)
Chloride: 103 mmol/L (ref 96–106)
Creatinine, Ser: 1.09 mg/dL (ref 0.76–1.27)
Globulin, Total: 2.2 g/dL (ref 1.5–4.5)
Glucose: 93 mg/dL (ref 70–99)
Potassium: 4.7 mmol/L (ref 3.5–5.2)
Sodium: 141 mmol/L (ref 134–144)
Total Protein: 6.7 g/dL (ref 6.0–8.5)
eGFR: 82 mL/min/{1.73_m2} (ref >59)

## 2021-05-07 LAB — LIPID PANEL
Chol/HDL Ratio: 4.7 ratio (ref 0.0–5.0)
Cholesterol, Total: 189 mg/dL (ref 100–199)
HDL: 40 mg/dL (ref >39)
LDL Chol Calc (NIH): 125 mg/dL — ABNORMAL HIGH (ref 0–99)
Triglycerides: 131 mg/dL (ref 0–149)
VLDL Cholesterol Cal: 24 mg/dL (ref 5–40)

## 2021-05-07 LAB — TSH: TSH: 1.56 u[IU]/mL (ref 0.450–4.500)

## 2021-05-07 LAB — CARDIOVASCULAR RISK ASSESSMENT

## 2021-06-22 DIAGNOSIS — R030 Elevated blood-pressure reading, without diagnosis of hypertension: Secondary | ICD-10-CM | POA: Diagnosis not present

## 2021-06-22 DIAGNOSIS — Z6830 Body mass index (BMI) 30.0-30.9, adult: Secondary | ICD-10-CM | POA: Diagnosis not present

## 2021-06-22 DIAGNOSIS — J020 Streptococcal pharyngitis: Secondary | ICD-10-CM | POA: Diagnosis not present

## 2021-06-22 DIAGNOSIS — E669 Obesity, unspecified: Secondary | ICD-10-CM | POA: Diagnosis not present

## 2021-06-28 DIAGNOSIS — J069 Acute upper respiratory infection, unspecified: Secondary | ICD-10-CM | POA: Diagnosis not present

## 2021-11-06 ENCOUNTER — Ambulatory Visit: Payer: Medicare Other | Admitting: Physician Assistant

## 2021-11-22 ENCOUNTER — Other Ambulatory Visit: Payer: Self-pay | Admitting: Physician Assistant

## 2021-11-22 DIAGNOSIS — K219 Gastro-esophageal reflux disease without esophagitis: Secondary | ICD-10-CM

## 2021-11-22 DIAGNOSIS — M158 Other polyosteoarthritis: Secondary | ICD-10-CM

## 2021-11-22 DIAGNOSIS — F418 Other specified anxiety disorders: Secondary | ICD-10-CM

## 2022-02-03 ENCOUNTER — Other Ambulatory Visit: Payer: Self-pay | Admitting: Physician Assistant

## 2022-02-03 DIAGNOSIS — F418 Other specified anxiety disorders: Secondary | ICD-10-CM

## 2022-02-09 ENCOUNTER — Encounter: Payer: Self-pay | Admitting: Physician Assistant

## 2022-02-12 ENCOUNTER — Other Ambulatory Visit: Payer: Self-pay

## 2022-02-12 DIAGNOSIS — F418 Other specified anxiety disorders: Secondary | ICD-10-CM

## 2022-02-12 MED ORDER — BUSPIRONE HCL 5 MG PO TABS: 5.0000 mg | ORAL_TABLET | Freq: Two times a day (BID) | ORAL | 0 refills | Status: AC

## 2022-02-12 MED ORDER — PAROXETINE HCL 20 MG PO TABS: 20.0000 mg | ORAL_TABLET | Freq: Every day | ORAL | 0 refills | Status: AC

## 2022-03-01 DIAGNOSIS — Z6830 Body mass index (BMI) 30.0-30.9, adult: Secondary | ICD-10-CM | POA: Diagnosis not present

## 2022-03-01 DIAGNOSIS — M5136 Other intervertebral disc degeneration, lumbar region: Secondary | ICD-10-CM | POA: Diagnosis not present

## 2022-03-01 DIAGNOSIS — F411 Generalized anxiety disorder: Secondary | ICD-10-CM | POA: Diagnosis not present

## 2022-03-01 DIAGNOSIS — E6609 Other obesity due to excess calories: Secondary | ICD-10-CM | POA: Diagnosis not present

## 2022-03-01 DIAGNOSIS — K219 Gastro-esophageal reflux disease without esophagitis: Secondary | ICD-10-CM | POA: Diagnosis not present

## 2022-03-01 DIAGNOSIS — F410 Panic disorder [episodic paroxysmal anxiety] without agoraphobia: Secondary | ICD-10-CM | POA: Diagnosis not present

## 2022-03-12 ENCOUNTER — Ambulatory Visit: Payer: Medicare Other | Admitting: Physician Assistant

## 2022-05-31 DIAGNOSIS — Z0000 Encounter for general adult medical examination without abnormal findings: Secondary | ICD-10-CM | POA: Diagnosis not present

## 2022-05-31 DIAGNOSIS — E6609 Other obesity due to excess calories: Secondary | ICD-10-CM | POA: Diagnosis not present

## 2022-05-31 DIAGNOSIS — K219 Gastro-esophageal reflux disease without esophagitis: Secondary | ICD-10-CM | POA: Diagnosis not present

## 2022-05-31 DIAGNOSIS — Z13220 Encounter for screening for lipoid disorders: Secondary | ICD-10-CM | POA: Diagnosis not present

## 2022-05-31 DIAGNOSIS — Z6830 Body mass index (BMI) 30.0-30.9, adult: Secondary | ICD-10-CM | POA: Diagnosis not present

## 2022-05-31 DIAGNOSIS — Z1211 Encounter for screening for malignant neoplasm of colon: Secondary | ICD-10-CM | POA: Diagnosis not present

## 2022-05-31 DIAGNOSIS — M5136 Other intervertebral disc degeneration, lumbar region: Secondary | ICD-10-CM | POA: Diagnosis not present

## 2022-05-31 DIAGNOSIS — F410 Panic disorder [episodic paroxysmal anxiety] without agoraphobia: Secondary | ICD-10-CM | POA: Diagnosis not present

## 2022-05-31 DIAGNOSIS — Z1159 Encounter for screening for other viral diseases: Secondary | ICD-10-CM | POA: Diagnosis not present

## 2022-05-31 DIAGNOSIS — F411 Generalized anxiety disorder: Secondary | ICD-10-CM | POA: Diagnosis not present

## 2022-05-31 DIAGNOSIS — Z131 Encounter for screening for diabetes mellitus: Secondary | ICD-10-CM | POA: Diagnosis not present

## 2022-06-09 DIAGNOSIS — Z1211 Encounter for screening for malignant neoplasm of colon: Secondary | ICD-10-CM | POA: Diagnosis not present

## 2022-12-20 DIAGNOSIS — F410 Panic disorder [episodic paroxysmal anxiety] without agoraphobia: Secondary | ICD-10-CM | POA: Diagnosis not present

## 2022-12-20 DIAGNOSIS — K219 Gastro-esophageal reflux disease without esophagitis: Secondary | ICD-10-CM | POA: Diagnosis not present

## 2022-12-20 DIAGNOSIS — F411 Generalized anxiety disorder: Secondary | ICD-10-CM | POA: Diagnosis not present

## 2023-02-08 DIAGNOSIS — S61412A Laceration without foreign body of left hand, initial encounter: Secondary | ICD-10-CM | POA: Diagnosis not present

## 2023-02-08 DIAGNOSIS — S62606A Fracture of unspecified phalanx of right little finger, initial encounter for closed fracture: Secondary | ICD-10-CM | POA: Diagnosis not present

## 2023-02-08 DIAGNOSIS — Z881 Allergy status to other antibiotic agents status: Secondary | ICD-10-CM | POA: Diagnosis not present

## 2023-02-08 DIAGNOSIS — W312XXA Contact with powered woodworking and forming machines, initial encounter: Secondary | ICD-10-CM | POA: Diagnosis not present

## 2023-02-08 DIAGNOSIS — S62627A Displaced fracture of medial phalanx of left little finger, initial encounter for closed fracture: Secondary | ICD-10-CM | POA: Diagnosis not present

## 2023-02-08 DIAGNOSIS — Z23 Encounter for immunization: Secondary | ICD-10-CM | POA: Diagnosis not present

## 2023-02-08 DIAGNOSIS — S61213A Laceration without foreign body of left middle finger without damage to nail, initial encounter: Secondary | ICD-10-CM | POA: Diagnosis not present

## 2023-02-09 DIAGNOSIS — S68617A Complete traumatic transphalangeal amputation of left little finger, initial encounter: Secondary | ICD-10-CM | POA: Diagnosis not present

## 2023-02-10 DIAGNOSIS — S68627A Partial traumatic transphalangeal amputation of left little finger, initial encounter: Secondary | ICD-10-CM | POA: Diagnosis not present

## 2023-02-10 DIAGNOSIS — Z91013 Allergy to seafood: Secondary | ICD-10-CM | POA: Diagnosis not present

## 2023-02-10 DIAGNOSIS — Z881 Allergy status to other antibiotic agents status: Secondary | ICD-10-CM | POA: Diagnosis not present

## 2023-02-10 DIAGNOSIS — M899 Disorder of bone, unspecified: Secondary | ICD-10-CM | POA: Diagnosis not present

## 2023-02-10 DIAGNOSIS — Z91012 Allergy to eggs: Secondary | ICD-10-CM | POA: Diagnosis not present

## 2023-02-10 DIAGNOSIS — W312XXA Contact with powered woodworking and forming machines, initial encounter: Secondary | ICD-10-CM | POA: Diagnosis not present
